# Patient Record
Sex: Male | Born: 1960 | Race: White | Hispanic: Yes | Marital: Married | Smoking: Never smoker
Health system: Southern US, Community
[De-identification: ages and names within clinical notes are randomized; demographics above are authoritative.]

## PROBLEM LIST (undated history)

## (undated) DIAGNOSIS — J45909 Unspecified asthma, uncomplicated: Secondary | ICD-10-CM

## (undated) DIAGNOSIS — D649 Anemia, unspecified: Secondary | ICD-10-CM

## (undated) DIAGNOSIS — K219 Gastro-esophageal reflux disease without esophagitis: Secondary | ICD-10-CM

## (undated) DIAGNOSIS — E785 Hyperlipidemia, unspecified: Secondary | ICD-10-CM

## (undated) DIAGNOSIS — M1990 Unspecified osteoarthritis, unspecified site: Secondary | ICD-10-CM

## (undated) DIAGNOSIS — K4090 Unilateral inguinal hernia, without obstruction or gangrene, not specified as recurrent: Secondary | ICD-10-CM

## (undated) DIAGNOSIS — T7840XA Allergy, unspecified, initial encounter: Secondary | ICD-10-CM

## (undated) DIAGNOSIS — K625 Hemorrhage of anus and rectum: Secondary | ICD-10-CM

## (undated) HISTORY — DX: Hyperlipidemia, unspecified: E78.5

## (undated) HISTORY — DX: Gastro-esophageal reflux disease without esophagitis: K21.9

## (undated) HISTORY — DX: Unspecified asthma, uncomplicated: J45.909

## (undated) HISTORY — PX: COLONOSCOPY: SHX174

## (undated) HISTORY — DX: Allergy, unspecified, initial encounter: T78.40XA

## (undated) HISTORY — PX: UPPER GASTROINTESTINAL ENDOSCOPY: SHX188

## (undated) HISTORY — DX: Unspecified osteoarthritis, unspecified site: M19.90

## (undated) HISTORY — PX: HEMORRHOID SURGERY: SHX153

## (undated) HISTORY — DX: Hemorrhage of anus and rectum: K62.5

## (undated) HISTORY — DX: Unilateral inguinal hernia, without obstruction or gangrene, not specified as recurrent: K40.90

## (undated) HISTORY — DX: Anemia, unspecified: D64.9

---

## 1990-11-20 HISTORY — PX: NOSE SURGERY: SHX723

## 2006-11-08 ENCOUNTER — Ambulatory Visit: Payer: Self-pay | Admitting: Family Medicine

## 2006-11-16 ENCOUNTER — Encounter: Admission: RE | Admit: 2006-11-16 | Discharge: 2006-11-16 | Payer: Self-pay | Admitting: Sports Medicine

## 2006-12-07 ENCOUNTER — Ambulatory Visit: Payer: Self-pay | Admitting: Family Medicine

## 2007-01-16 ENCOUNTER — Ambulatory Visit: Payer: Self-pay | Admitting: Family Medicine

## 2007-01-17 DIAGNOSIS — K219 Gastro-esophageal reflux disease without esophagitis: Secondary | ICD-10-CM | POA: Insufficient documentation

## 2007-01-17 DIAGNOSIS — K649 Unspecified hemorrhoids: Secondary | ICD-10-CM | POA: Insufficient documentation

## 2007-01-25 ENCOUNTER — Encounter: Payer: Self-pay | Admitting: *Deleted

## 2007-01-25 ENCOUNTER — Ambulatory Visit: Payer: Self-pay | Admitting: Sports Medicine

## 2007-01-25 ENCOUNTER — Encounter (INDEPENDENT_AMBULATORY_CARE_PROVIDER_SITE_OTHER): Payer: Self-pay | Admitting: Family Medicine

## 2007-01-25 DIAGNOSIS — E785 Hyperlipidemia, unspecified: Secondary | ICD-10-CM | POA: Insufficient documentation

## 2007-01-25 DIAGNOSIS — R5381 Other malaise: Secondary | ICD-10-CM | POA: Insufficient documentation

## 2007-01-25 DIAGNOSIS — R5383 Other fatigue: Secondary | ICD-10-CM

## 2007-01-28 ENCOUNTER — Encounter (INDEPENDENT_AMBULATORY_CARE_PROVIDER_SITE_OTHER): Payer: Self-pay | Admitting: Family Medicine

## 2007-01-28 ENCOUNTER — Encounter (INDEPENDENT_AMBULATORY_CARE_PROVIDER_SITE_OTHER): Payer: Self-pay

## 2007-01-28 LAB — CONVERTED CEMR LAB
BUN: 14 mg/dL (ref 6–23)
Chloride: 100 meq/L (ref 96–112)
Cholesterol: 207 mg/dL — ABNORMAL HIGH (ref 0–200)
Glucose, Bld: 83 mg/dL (ref 70–99)
LDL Cholesterol: 139 mg/dL — ABNORMAL HIGH (ref 0–99)
Potassium: 4.3 meq/L (ref 3.5–5.3)
Sodium: 137 meq/L (ref 135–145)
TSH: 0.564 microintl units/mL (ref 0.350–5.50)
Total CHOL/HDL Ratio: 3.7

## 2007-02-26 ENCOUNTER — Encounter (INDEPENDENT_AMBULATORY_CARE_PROVIDER_SITE_OTHER): Payer: Self-pay | Admitting: Family Medicine

## 2007-02-26 ENCOUNTER — Ambulatory Visit: Payer: Self-pay | Admitting: Family Medicine

## 2007-02-26 DIAGNOSIS — R7401 Elevation of levels of liver transaminase levels: Secondary | ICD-10-CM | POA: Insufficient documentation

## 2007-02-26 DIAGNOSIS — R740 Nonspecific elevation of levels of transaminase and lactic acid dehydrogenase [LDH]: Secondary | ICD-10-CM

## 2007-02-26 DIAGNOSIS — M6580 Other synovitis and tenosynovitis, unspecified site: Secondary | ICD-10-CM | POA: Insufficient documentation

## 2007-02-27 ENCOUNTER — Encounter (INDEPENDENT_AMBULATORY_CARE_PROVIDER_SITE_OTHER): Payer: Self-pay | Admitting: Family Medicine

## 2007-02-27 LAB — CONVERTED CEMR LAB
CO2: 26 meq/L (ref 19–32)
Calcium: 9.9 mg/dL (ref 8.4–10.5)
Chloride: 104 meq/L (ref 96–112)
Creatinine, Ser: 0.82 mg/dL (ref 0.40–1.50)
Potassium: 3.9 meq/L (ref 3.5–5.3)

## 2008-07-30 ENCOUNTER — Ambulatory Visit: Payer: Self-pay | Admitting: Family Medicine

## 2008-07-30 ENCOUNTER — Encounter (INDEPENDENT_AMBULATORY_CARE_PROVIDER_SITE_OTHER): Payer: Self-pay | Admitting: Family Medicine

## 2008-07-30 DIAGNOSIS — R300 Dysuria: Secondary | ICD-10-CM | POA: Insufficient documentation

## 2008-07-30 DIAGNOSIS — Z8719 Personal history of other diseases of the digestive system: Secondary | ICD-10-CM | POA: Insufficient documentation

## 2008-07-30 DIAGNOSIS — M7710 Lateral epicondylitis, unspecified elbow: Secondary | ICD-10-CM | POA: Insufficient documentation

## 2008-07-30 LAB — CONVERTED CEMR LAB
Bilirubin Urine: NEGATIVE
Glucose, Urine, Semiquant: NEGATIVE
Protein, U semiquant: NEGATIVE
Urobilinogen, UA: 0.2
WBC Urine, dipstick: NEGATIVE

## 2008-08-02 DIAGNOSIS — Z87448 Personal history of other diseases of urinary system: Secondary | ICD-10-CM | POA: Insufficient documentation

## 2008-08-07 ENCOUNTER — Ambulatory Visit: Payer: Self-pay | Admitting: Family Medicine

## 2008-08-07 LAB — CONVERTED CEMR LAB
Blood in Urine, dipstick: NEGATIVE
Glucose, Urine, Semiquant: NEGATIVE
Specific Gravity, Urine: 1.02
Urobilinogen, UA: 0.2

## 2008-08-10 ENCOUNTER — Encounter (INDEPENDENT_AMBULATORY_CARE_PROVIDER_SITE_OTHER): Payer: Self-pay | Admitting: Family Medicine

## 2008-08-10 LAB — CONVERTED CEMR LAB
AST: 18 units/L (ref 0–37)
Alkaline Phosphatase: 83 units/L (ref 39–117)
Chloride: 105 meq/L (ref 96–112)
Creatinine, Ser: 0.8 mg/dL (ref 0.40–1.50)
HDL: 44 mg/dL (ref >39)
Hemoglobin: 12.9 g/dL — ABNORMAL LOW (ref 13.0–17.0)
LDL Cholesterol: 143 mg/dL — ABNORMAL HIGH (ref 0–99)
MCV: 88 fL (ref 78.0–100.0)
RBC: 4.43 M/uL (ref 4.22–5.81)
RDW: 13.9 % (ref 11.5–15.5)
Total CHOL/HDL Ratio: 5.1
Total Protein: 7.8 g/dL (ref 6.0–8.3)

## 2010-03-14 ENCOUNTER — Emergency Department (HOSPITAL_BASED_OUTPATIENT_CLINIC_OR_DEPARTMENT_OTHER): Admission: EM | Admit: 2010-03-14 | Discharge: 2010-03-14 | Payer: Self-pay | Admitting: Emergency Medicine

## 2011-01-01 ENCOUNTER — Encounter: Payer: Self-pay | Admitting: *Deleted

## 2011-05-22 ENCOUNTER — Ambulatory Visit (INDEPENDENT_AMBULATORY_CARE_PROVIDER_SITE_OTHER): Payer: Self-pay | Admitting: Surgery

## 2011-06-02 ENCOUNTER — Encounter (INDEPENDENT_AMBULATORY_CARE_PROVIDER_SITE_OTHER): Payer: Self-pay | Admitting: General Surgery

## 2011-06-02 ENCOUNTER — Ambulatory Visit (INDEPENDENT_AMBULATORY_CARE_PROVIDER_SITE_OTHER): Payer: 59 | Admitting: Surgery

## 2011-06-02 ENCOUNTER — Encounter (INDEPENDENT_AMBULATORY_CARE_PROVIDER_SITE_OTHER): Payer: Self-pay | Admitting: Surgery

## 2011-06-02 VITALS — BP 132/90 | HR 79 | Temp 96.8°F | Ht 65.0 in | Wt 167.8 lb

## 2011-06-02 DIAGNOSIS — K648 Other hemorrhoids: Secondary | ICD-10-CM

## 2011-06-02 NOTE — Patient Instructions (Signed)
Follow up in three weeks.  High fiber diet.  Drink plenty of water.  Expect some bleeding.

## 2011-06-02 NOTE — Progress Notes (Signed)
Subjective:     Patient ID: Keith Long, male   DOB: 09-11-1961, 50 y.o.   MRN: [id number]    BP 132/90  Pulse 79  Temp 96.8 F (36 C)  Ht 5\' 5"  (1.651 m)  Wt 167 lb 12.8 oz (76.114 kg)  BMI 27.92 kg/m2    HPI The patient returns to clinic today. He has grade 2 hemorrhoid disease. His complaints are bleeding, itching, and fullness of the anus. He also has some burning and itching and I saw him a month ago in the office, and he wished to return to have sclerotherapy done. I discussed the procedure with him today. Risks of bleeding, infection, recurrence, and pain were discussed. I contrast this with banding and hemorrhoidectomy. After discussion he wished to proceed with sclerotherapy.   Review of Systems  Constitutional: Negative.   HENT: Negative.   Eyes: Negative.   Respiratory: Negative.   Gastrointestinal: Negative.   Hematological: Negative.   Psychiatric/Behavioral: Negative.        Objective:   Physical Exam  Constitutional: He appears well-developed and well-nourished.  HENT:  Head: Normocephalic and atraumatic.  Nose: Nose normal.  Eyes: Conjunctivae and EOM are normal.  Neck: Normal range of motion. Neck supple.  Genitourinary:       The anal canal is without mass.  Tone normal.  Anoscopy shows grade 2 to 3 left lateral internal pile.  The anoscope was used.  A right posterior pile was also seen.  Each were injected with 1.5 cc of sclerosant. He tolerated this well.  Musculoskeletal: Normal range of motion.  Skin: Skin is warm and dry.  Psychiatric: He has a normal mood and affect. His behavior is normal. Judgment and thought content normal.       Assessment:  Grade 2 to 3 internal hemorrhoids 2 columns.   Plan:  I injected his internal hemorrhoids today with sclerosant. He tolerated the procedure well. He will return in 2-3 weeks for a recheck. Recommend a high fiber diet and increasing his water intake. Recommend avoiding prolonged straining with bowel  movements.

## 2011-07-04 ENCOUNTER — Encounter (INDEPENDENT_AMBULATORY_CARE_PROVIDER_SITE_OTHER): Payer: Self-pay | Admitting: Surgery

## 2011-07-04 ENCOUNTER — Ambulatory Visit (INDEPENDENT_AMBULATORY_CARE_PROVIDER_SITE_OTHER): Payer: 59 | Admitting: Surgery

## 2011-07-04 DIAGNOSIS — K648 Other hemorrhoids: Secondary | ICD-10-CM

## 2011-07-04 NOTE — Progress Notes (Signed)
The patient returns to clinic today. He is doing better. Patient states he is having no bleeding from his rectum, itching or discharge. He is 3 weeks out from sclerotherapy for bleeding hemorrhoids.  Review of systems: Negative for rectal bleeding, itching or discharge  Physical exam:  GI: Anal canal is without bleeding. No evidence of hemorrhoid prolapse. No erythema. No fissure.  Neuro: Glasgow Coma Scale is 15 motor and sensory function grossly intact  Musculoskeletal: Good muscle tone. Good range of motion.  Impression grade 3 internal hemorrhoids status post sclerotherapy  Plan: He will return if symptoms recur. I recommended a high fiber diet to him. I've encouraged him to exercise daily. I've encouraged him to refrain from pro long sitting on the commode.

## 2011-07-04 NOTE — Patient Instructions (Signed)
Continue high fiber diet.  Exercise daily.  Call if symptoms return. Avoid prolonged sitting on commode.

## 2012-03-27 ENCOUNTER — Other Ambulatory Visit: Payer: Self-pay | Admitting: Family Medicine

## 2012-03-27 DIAGNOSIS — R911 Solitary pulmonary nodule: Secondary | ICD-10-CM

## 2012-04-01 ENCOUNTER — Ambulatory Visit
Admission: RE | Admit: 2012-04-01 | Discharge: 2012-04-01 | Disposition: A | Discharge status: Home / Self Care | Payer: 59 | Source: Ambulatory Visit | Attending: Family Medicine | Admitting: Family Medicine

## 2012-04-01 DIAGNOSIS — R911 Solitary pulmonary nodule: Secondary | ICD-10-CM

## 2013-05-05 ENCOUNTER — Ambulatory Visit (INDEPENDENT_AMBULATORY_CARE_PROVIDER_SITE_OTHER): Payer: 59 | Admitting: Surgery

## 2013-05-05 ENCOUNTER — Encounter (INDEPENDENT_AMBULATORY_CARE_PROVIDER_SITE_OTHER): Payer: Self-pay | Admitting: Surgery

## 2013-05-05 VITALS — BP 122/70 | HR 71 | Temp 98.1°F | Resp 16 | Ht 65.0 in | Wt 163.6 lb

## 2013-05-05 DIAGNOSIS — K4090 Unilateral inguinal hernia, without obstruction or gangrene, not specified as recurrent: Secondary | ICD-10-CM

## 2013-05-05 NOTE — Progress Notes (Signed)
General Surgery [facility]  Chief Complaint  Patient presents with  . New Evaluation    evaluate for right inguinal hernia - referral from Dr. Juline Patch, [facility]    HISTORY: Patient is a 52 year old male referred by his primary care physician for newly diagnosed right inguinal hernia. Patient has stated that he has had intermittent right groin pain associated with physical activity for approximately one to 2 years. There has been a small bulge in the right groin which is gradually become larger. He denies any signs or symptoms of obstruction. He has had no prior abdominal surgery. He has had no symptoms on the left side.  Past Medical History  Diagnosis Date  . Hemorrhoids   . Rectal bleeding      No current outpatient prescriptions on file.   No current facility-administered medications for this visit.     No Known Allergies   Family History  Problem Relation Age of Onset  . Diabetes Mother   . Stroke Father      History   Social History  . Marital Status: Married    Spouse Name: N/A    Number of Children: N/A  . Years of Education: N/A   Social History Main Topics  . Smoking status: Never Smoker   . Smokeless tobacco: None  . Alcohol Use: Yes     Comment: occasional  . Drug Use: No  . Sexually Active: None   Other Topics Concern  . None   Social History Narrative  . None     REVIEW OF SYSTEMS - PERTINENT POSITIVES ONLY: Denies signs or symptoms of obstruction. Denies previous abdominal surgery.  No prior hernia repairs.  EXAM: Filed Vitals:   05/05/13 1344  BP: 122/70  Pulse: 71  Temp: 98.1 F (36.7 C)  Resp: 16    HEENT: normocephalic; pupils equal and reactive; sclerae clear; dentition good; mucous membranes moist NECK:  symmetric on extension; no palpable anterior or posterior cervical lymphadenopathy; no supraclavicular masses; no tenderness CHEST: clear to auscultation bilaterally without  rales, rhonchi, or wheezes CARDIAC: regular rate and rhythm without significant murmur; peripheral pulses are full ABDOMEN: soft without distension; bowel sounds present; no mass; no hepatosplenomegaly; no hernia GU:  Normal male genitalia without mass or lesion; prominent bulge in the right groin compared to left; palpation in right shows a small to moderate right inguinal hernia which augments with cough and Valsalva; reducible; minimally tender; palpation in the left inguinal canal with cough and Valsalva shows no sign of hernia EXT:  non-tender without edema; no deformity NEURO: no gross focal deficits; no sign of tremor   LABORATORY RESULTS: See [facility] ([facility]Epic) for most recent results   RADIOLOGY RESULTS: See [facility] (CHL[facility]Epic) for most recent results   IMPRESSION: Right inguinal hernia, reducible, mildly symptomatic  PLAN: I discussed with the patient the indications for surgical repair. I provided him with written literature to review. We discussed the options for management. I have recommended open right inguinal hernia repair with mesh do to its low recurrence rate. We discussed the procedure, the restrictions on his activities after surgery, and his recovery and return to full activity. He understands and wishes to proceed in the near future.  The risks and benefits of the procedure have been discussed at length with the patient.  The patient understands the proposed procedure, potential alternative treatments, and the course of recovery to be expected.  All of the patient's questions have been answered  at this time.  The patient wishes to proceed with surgery.  Velora Heckler, MD, FACS General & Endocrine Surgery [facility]   Visit Diagnoses: 1. Inguinal hernia unilateral, non-recurrent, right     Primary Care Physician: Juline Patch, MD

## 2013-05-05 NOTE — Patient Instructions (Signed)
[facility]  HERNIA REPAIR POST OP INSTRUCTIONS  Always review your discharge instruction sheet given to you by the facility where your surgery was performed.  1. A  prescription for pain medication may be given to you upon discharge.  Take your pain medication as prescribed.  If narcotic pain medicine is not needed, then you may take acetaminophen (Tylenol) or ibuprofen (Advil) as needed.  2. Take your usually prescribed medications unless otherwise directed.  3. If you need a refill on your pain medication, please contact your pharmacy.  They will contact our office to request authorization. Prescriptions will not be filled after 5 pm daily or on weekends.  4. You should follow a light diet the first 24 hours after arrival home, such as soup and crackers or toast.  Be sure to include plenty of fluids daily.  Resume your normal diet the day after surgery.  5. Most patients will experience some swelling and bruising around the surgical site.  Ice packs and reclining will help.  Swelling and bruising can take several days to resolve.   6. It is common to experience some constipation if taking pain medication after surgery.  Increasing fluid intake and taking a stool softener (such as Colace) will usually help or prevent this problem from occurring.  A mild laxative (Milk of Magnesia or Miralax) should be taken according to package directions if there are no bowel movements after 48 hours.  7. Unless discharge instructions indicate otherwise, you may remove your bandages 24-48 hours after surgery, and you may shower at that time.  You may have steri-strips (small skin tapes) in place directly over the incision.  These strips should be left on the skin for 7-10 days.  If your surgeon used skin glue on the incision, you may shower in 24 hours.  The glue will flake off over the next 2-3 weeks.  Any sutures or staples will be removed at the office during your follow-up  visit.  8. ACTIVITIES:  You may resume regular (light) daily activities beginning the next day-such as daily self-care, walking, climbing stairs-gradually increasing activities as tolerated.  You may have sexual intercourse when it is comfortable.  Refrain from any heavy lifting or straining until approved by your doctor.  You may drive when you are no longer taking prescription pain medication, you can comfortably wear a seatbelt, and you can safely maneuver your car and apply brakes.  9. You should see your doctor in the office for a follow-up appointment approximately 2-3 weeks after your surgery.  Make sure that you call for this appointment within a day or two after you arrive home to insure a convenient appointment time. 10.   WHEN TO CALL YOUR DOCTOR: 1. Fever greater than 101.0 2. Inability to urinate 3. Persistent nausea and/or vomiting 4. Extreme swelling or bruising 5. Continued bleeding from incision 6. Increased pain, redness, or drainage from the incision  The clinic staff is available to answer your questions during regular business hours.  Please don't hesitate to call and ask to speak to one of the nurses for clinical concerns.  If you have a medical emergency, go to the nearest emergency room or call 911.  A surgeon from [facility] is always on call for the hospital.   [facility] [address], [address], [place], [place]  [address]  [phone] ? [phone] ? FAX [phone]  [email or web address]   

## 2014-11-20 DIAGNOSIS — K4090 Unilateral inguinal hernia, without obstruction or gangrene, not specified as recurrent: Secondary | ICD-10-CM

## 2014-11-20 HISTORY — DX: Unilateral inguinal hernia, without obstruction or gangrene, not specified as recurrent: K40.90

## 2015-05-11 ENCOUNTER — Other Ambulatory Visit (INDEPENDENT_AMBULATORY_CARE_PROVIDER_SITE_OTHER): Payer: BLUE CROSS/BLUE SHIELD

## 2015-05-11 ENCOUNTER — Encounter: Payer: Self-pay | Admitting: Internal Medicine

## 2015-05-11 ENCOUNTER — Ambulatory Visit (INDEPENDENT_AMBULATORY_CARE_PROVIDER_SITE_OTHER): Payer: BLUE CROSS/BLUE SHIELD | Admitting: Internal Medicine

## 2015-05-11 ENCOUNTER — Ambulatory Visit (INDEPENDENT_AMBULATORY_CARE_PROVIDER_SITE_OTHER)
Admission: RE | Admit: 2015-05-11 | Discharge: 2015-05-11 | Disposition: A | Discharge status: Home / Self Care | Payer: BLUE CROSS/BLUE SHIELD | Source: Ambulatory Visit | Attending: Internal Medicine | Admitting: Internal Medicine

## 2015-05-11 VITALS — BP 138/84 | HR 72 | Ht 65.0 in | Wt 164.2 lb

## 2015-05-11 DIAGNOSIS — J45991 Cough variant asthma: Secondary | ICD-10-CM

## 2015-05-11 LAB — CBC WITH DIFFERENTIAL/PLATELET
BASOS PCT: 0.7 % (ref 0.0–3.0)
Basophils Absolute: 0.1 10*3/uL (ref 0.0–0.1)
EOS ABS: 0.5 10*3/uL (ref 0.0–0.7)
Eosinophils Relative: 6.1 % — ABNORMAL HIGH (ref 0.0–5.0)
HCT: 40.5 % (ref 39.0–52.0)
Hemoglobin: 13.4 g/dL (ref 13.0–17.0)
Lymphocytes Relative: 43.5 % (ref 12.0–46.0)
Lymphs Abs: 3.2 10*3/uL (ref 0.7–4.0)
MCHC: 33.1 g/dL (ref 30.0–36.0)
MCV: 83.2 fl (ref 78.0–100.0)
MONO ABS: 0.4 10*3/uL (ref 0.1–1.0)
Monocytes Relative: 5.3 % (ref 3.0–12.0)
NEUTROS PCT: 44.4 % (ref 43.0–77.0)
Neutro Abs: 3.3 10*3/uL (ref 1.4–7.7)
Platelets: 292 10*3/uL (ref 150.0–400.0)
RBC: 4.86 Mil/uL (ref 4.22–5.81)
RDW: 15 % (ref 11.5–15.5)
WBC: 7.4 10*3/uL (ref 4.0–10.5)

## 2015-05-11 MED ORDER — PREDNISONE 10 MG PO TABS: ORAL_TABLET | ORAL | Status: DC

## 2015-05-11 MED ORDER — AMOXICILLIN-POT CLAVULANATE 875-125 MG PO TABS: 1.0000 | ORAL_TABLET | Freq: Two times a day (BID) | ORAL | Status: DC

## 2015-05-11 MED ORDER — FAMOTIDINE 20 MG PO TABS: ORAL_TABLET | ORAL | Status: DC

## 2015-05-11 MED ORDER — MONTELUKAST SODIUM 10 MG PO TABS: 10.0000 mg | ORAL_TABLET | Freq: Every day | ORAL | Status: DC

## 2015-05-11 MED ORDER — BENZONATATE 200 MG PO CAPS: ORAL_CAPSULE | ORAL | Status: DC

## 2015-05-11 MED ORDER — ALBUTEROL SULFATE HFA 108 (90 BASE) MCG/ACT IN AERS: 2.0000 | INHALATION_SPRAY | Freq: Four times a day (QID) | RESPIRATORY_TRACT | Status: DC | PRN

## 2015-05-11 MED ORDER — OMEPRAZOLE 20 MG PO CPDR: DELAYED_RELEASE_CAPSULE | ORAL | Status: DC

## 2015-05-11 MED ORDER — MOMETASONE FURO-FORMOTEROL FUM 100-5 MCG/ACT IN AERO: 2.0000 | INHALATION_SPRAY | Freq: Two times a day (BID) | RESPIRATORY_TRACT | Status: DC

## 2015-05-11 NOTE — Assessment & Plan Note (Signed)
The most common causes of chronic cough in immunocompetent adults include the following: upper airway cough syndrome (UACS), previously referred to as postnasal drip syndrome (PNDS), which is caused by variety of rhinosinus conditions; (2) asthma; (3) GERD; (4) chronic bronchitis from cigarette smoking or other inhaled environmental irritants; (5) nonasthmatic eosinophilic bronchitis; and (6) bronchiectasis.   These conditions, singly or in combination, have accounted for up to 94% of the causes of chronic cough in prospective studies.   Other conditions have constituted no >6% of the causes in prospective studies These have included bronchogenic carcinoma, chronic interstitial pneumonia, sarcoidosis, left ventricular failure, ACEI-induced cough, and aspiration from a condition associated with pharyngeal dysfunction.    Chronic cough is often simultaneously caused by more than one condition. A single cause has been found from 38 to 82% of the time, multiple causes from 18 to 62%. Multiply caused cough has been the result of three diseases up to 42% of the time.       Most likely this is combination of gerd/ ab and chronic rhinitis sinusitis, but difficult to tease out as they may be interrelated  Of the three most common causes of chronic cough, only one (GERD)  can actually cause the other two (asthma and post nasal drip syndrome)  and perpetuate the cylce of cough inducing airway trauma, inflammation, heightened sensitivity to reflux which is prompted by the cough itself via a cyclical mechanism.    This may partially respond to steroids and look like asthma and post nasal drainage but never erradicated completely unless the cough and the secondary reflux are eliminated, preferably both at the same time.  While not intuitively obvious, many patients with chronic low grade reflux do not cough until there is a secondary insult that disturbs the protective epithelial barrier and exposes sensitive  nerve endings.  This can be viral or direct physical injury such as with an endotracheal tube.   The point is that once this occurs, it is difficult to eliminate using anything but a maximally effective acid suppression regimen at least in the short run, accompanied by an appropriate diet to address non acid GERD.   I had an extended discussion with the patient reviewing all relevant studies completed to date - 59 m  - The proper method of use, as well as anticipated side effects, of a metered-dose inhaler are discussed and demonstrated to the patient. Improved effectiveness after extensive coaching during this visit to a level of approximately  90%  - .Each maintenance medication was reviewed in detail including most importantly the difference between maintenance and as needed and under what circumstances the prns are to be used.  Please see instructions for details which were reviewed in writing and the patient given a copy.

## 2015-05-11 NOTE — Patient Instructions (Addendum)
Change Prilosec 20 mg x 2 x 30 min before 1st meal and  Add pepcid ac 20 one at bedtime   Augmentin 875 mg take one pill twice daily  X 10 days - take at breakfast and supper with large glass of water.  It would help reduce the usual side effects (diarrhea and yeast infections) if you ate cultured yogurt at lunch.   Prednisone 10 mg take  4 each am x 2 days,   2 each am x 2 days,  1 each am x 2 days and stop   Start dulera 100 Take 2 puffs first thing in am and then another 2 puffs about 12 hours later  Stop singulair and proair and tessilon   GERD (REFLUX)  is an extremely common cause of respiratory symptoms just like yours , many times with no obvious heartburn at all.    It can be treated with medication, but also with lifestyle changes including elevation of the head of your bed (ideally with 6 inch  bed blocks),  Smoking cessation, avoidance of late meals, excessive alcohol, and avoid fatty foods, chocolate, peppermint, colas, red wine, and acidic juices such as orange juice.  NO MINT OR MENTHOL PRODUCTS SO NO COUGH DROPS  USE SUGARLESS CANDY INSTEAD (Jolley ranchers or Stover's or Life Savers) or even ice chips will also do - the key is to swallow to prevent all throat clearing. NO OIL BASED VITAMINS - use powdered substitutes.    Please remember to go to the lab and x-ray department downstairs for your tests - we will call you with the results when they are available.    Please schedule a follow up office visit in 2 weeks, sooner if needed and bring all meds with you

## 2015-05-11 NOTE — Progress Notes (Signed)
   Subjective:    Patient ID: Keith Long, male    DOB: Feb 24, 1961,   MRN: [id number]  HPI  54 yo Keith Long male never smoker never problems until developed recurrent sinus infections x 2014 eval [facility] and rx nasal sprays and gerd rx and self referred to pulmonary eval  On 05/11/2015   05/11/2015 1st [facility] office visit/ Keith Long   Chief Complaint  Patient presents with  . Pulmonary Consult    Self referral. Pt c/o cough x 2 yrs- progressively getting worse. Cough is prod with minimal yellow/blood streaked sputum. Cough is esp worse at night.   cough x 2 years daily worst at hs and also when gets hot every single day and worse in am better with abx/prednisone and no better with gerd rx/nasal steroids saba and tessalon also no help at all.  No obvious day to day or daytime variabilty or assoc sob or  cp or chest tightness, subjective wheeze overt  hb symptoms. No unusual exp hx or h/o childhood pna/ asthma or knowledge of premature birth.  Also denies any obvious fluctuation of symptoms with weather or environmental changes or other aggravating or alleviating factors except as outlined above   Current Medications, Allergies, Complete Past Medical History, Past Surgical History, Family History, and Social History were reviewed in Keith Long record.   Review of Systems  Constitutional: Negative for fever, chills, activity change, appetite change and unexpected weight change.  HENT: Positive for congestion. Negative for dental problem, postnasal drip, rhinorrhea, sneezing, sore throat, trouble swallowing and voice change.   Eyes: Negative for visual disturbance.  Respiratory: Positive for cough. Negative for choking and shortness of breath.   Cardiovascular: Negative for chest pain and leg swelling.  Gastrointestinal: Negative for nausea, vomiting and abdominal pain.  Genitourinary: Negative for difficulty urinating.       Acid heartburn  Indigestion    Musculoskeletal: Positive for arthralgias.  Skin: Negative for rash.  Psychiatric/Behavioral: Negative for behavioral problems and confusion.       Objective:   Physical Exam  amb latino male nad   Wt Readings from Last 3 Encounters:  05/11/15 164 lb 3.2 oz (74.481 kg)  05/05/13 163 lb 10.1 oz (74.222 kg)  06/02/11 167 lb 12.8 oz (76.114 kg)    Vital signs reviewed  HEENT: nl dentition,  and orophanx. Nl external ear canals without cough reflex - mod non-specific turbinate edema    NECK :  without JVD/Nodes/TM/ nl carotid upstrokes bilaterally   LUNGS: no acc muscle use, bilateral insp pops/squeaks    CV:  RRR  no s3 or murmur or increase in P2, no edema   ABD:  soft and nontender with nl excursion in the supine position. No bruits or organomegaly, bowel sounds nl  MS:  warm without deformities, calf tenderness, cyanosis or clubbing  SKIN: warm and dry without lesions    NEURO:  alert, approp, no deficits    CXR PA and Lateral:   05/11/2015 :     I personally reviewed images and agree with radiology impression as follows:    The heart size and mediastinal contours are within normal limits. Both lungs are clear. The visualized skeletal structures are unremarkable.   Labs ordered : allergy profile/ wbc differential      Assessment & Plan:

## 2015-05-12 ENCOUNTER — Encounter: Payer: Self-pay | Admitting: Internal Medicine

## 2015-05-12 LAB — ALLERGY FULL PROFILE
ASPERGILLUS FUMIGATUS M3: 0.12 kU/L — AB
Allergen, D pternoyssinus,d7: <0.1 kU/L
Allergen,Goose feathers, e70: <0.1 kU/L
BERMUDA GRASS: 0.2 kU/L — AB
Bahia Grass: 0.24 kU/L — ABNORMAL HIGH
Box Elder IgE: 1.89 kU/L — ABNORMAL HIGH
Common Ragweed: 0.21 kU/L — ABNORMAL HIGH
ELM IGE: 0.68 kU/L — AB
FESCUE: 1.48 kU/L — AB
G005 Rye, Perennial: 1.41 kU/L — ABNORMAL HIGH
G009 RED TOP: 1.77 kU/L — AB
GOLDENROD: 0.22 kU/L — AB
House Dust Hollister: <0.1 kU/L
IgE (Immunoglobulin E), Serum: 208 kU/L — ABNORMAL HIGH (ref <115)
LAMB'S QUARTERS CLASS: 0.3 kU/L — AB
Oak: 12.7 kU/L — ABNORMAL HIGH
PLANTAIN: 0.52 kU/L — AB
SYCAMORE TREE: 0.38 kU/L — AB
Stemphylium Botryosum: <0.1 kU/L
Timothy Grass: 0.7 kU/L — ABNORMAL HIGH

## 2015-05-17 ENCOUNTER — Telehealth: Payer: Self-pay | Admitting: Internal Medicine

## 2015-05-17 MED ORDER — CEFDINIR 300 MG PO CAPS: 300.0000 mg | ORAL_CAPSULE | Freq: Two times a day (BID) | ORAL | Status: DC

## 2015-05-17 NOTE — Telephone Encounter (Signed)
I called spoke with pt. He reports the augmentin is making him feel un easy and jittery, nauseated as well. Pt wants to change ABX. Please advise Dr. Sherene SiresWert thanks

## 2015-05-17 NOTE — Telephone Encounter (Signed)
Spoke with pt. Aware of recs. He has taken 6 tabs of the augmentin. Remaining RX sent in. Nothing further needed

## 2015-05-17 NOTE — Telephone Encounter (Signed)
omnicef 300 mg bid to complete the course of abx for whatever duration would have been left

## 2015-05-25 ENCOUNTER — Encounter: Payer: Self-pay | Admitting: Internal Medicine

## 2015-05-25 ENCOUNTER — Ambulatory Visit (INDEPENDENT_AMBULATORY_CARE_PROVIDER_SITE_OTHER): Payer: BLUE CROSS/BLUE SHIELD | Admitting: Internal Medicine

## 2015-05-25 VITALS — BP 130/74 | HR 72 | Ht 65.0 in | Wt 164.6 lb

## 2015-05-25 DIAGNOSIS — J45991 Cough variant asthma: Secondary | ICD-10-CM | POA: Diagnosis not present

## 2015-05-25 MED ORDER — BUDESONIDE-FORMOTEROL FUMARATE 80-4.5 MCG/ACT IN AERO: INHALATION_SPRAY | RESPIRATORY_TRACT | Status: DC

## 2015-05-25 NOTE — Patient Instructions (Addendum)
symbicort 80 Take 2 puffs first thing in am and then another 2 puffs about 12 hours later  Work on inhaler technique:  relax and gently blow all the way out then take a nice smooth deep breath back in, triggering the inhaler at same time you start breathing in.  Hold for up to 5 seconds if you can. Blow out through your nose.  Rinse and gargle with water when done  [facility]   [facility] near you (Dr Caryn BeeKevin Little/Alan Ross/Wendy Uvaldo [place]) or  [facility] [place] other option Dr Drue NovelPaz   Please schedule a follow up visit in 3 months but call sooner if needed

## 2015-05-25 NOTE — Progress Notes (Signed)
Subjective:    Patient ID: Keith Long, male    DOB: 06-Aug-1961,   MRN: [id number]    Brief patient profile:  54 yo [place] male never smoker never problems until developed recurrent sinus infections while [place] 1999 x 2014 eval [facility] and rx nasal sprays and gerd rx and self referred to pulmonary eval  On 05/11/2015    History of Present Illness  05/11/2015 1st [facility] office visit/ [place]   Chief Complaint  Patient presents with  . Pulmonary Consult    Self referral. Pt c/o cough x 2 yrs- progressively getting worse. Cough is prod with minimal yellow/blood streaked sputum. Cough is esp worse at night.   cough x 2 years daily worst at hs and also when gets hot every single day and worse in am better with abx/prednisone and no better with gerd rx/nasal steroids saba and tessalon also no help at all. rec Change Prilosec 20 mg x 2 x 30 min before 1st meal and  Add pepcid ac 20 one at bedtime  Augmentin 875 mg take one pill twice daily  X 10 days - take at breakfast and supper with large glass of water.  It would help reduce the usual side effects (diarrhea and yeast infections) if you ate cultured yogurt at lunch.  Prednisone 10 mg take  4 each am x 2 days,   2 each am x 2 days,  1 each am x 2 days and stop  Start dulera 100 Take 2 puffs first thing in am and then another 2 puffs about 12 hours later Stop singulair and proair and tessilon  GERD  Diet      05/25/2015 f/u ov/Kamilya Wakeman re: asthma on dulera 100 2bid and gerd rx  Chief Complaint  Patient presents with  . Follow-up    Cough is much improved. No new co's today.    Not limited by breathing from desired activities  / some persistent nasal congestion  No obvious day to day or daytime variabilty or assoc chronic cough or cp or chest tightness, subjective wheeze overt  hb symptoms. No unusual exp hx or h/o childhood pna/ asthma or knowledge of premature birth.  Sleeping ok without nocturnal  or early am exacerbation  of  respiratory  c/o's or need for noct saba. Also denies any obvious fluctuation of symptoms with weather or environmental changes or other aggravating or alleviating factors except as outlined above   Current Medications, Allergies, Complete Past Medical History, Past Surgical History, Family History, and Social History were reviewed [facility].  ROS  The following are not active complaints unless bolded sore throat, dysphagia, dental problems, itching, sneezing,  nasal congestion or excess/ purulent secretions, ear ache,   fever, chills, sweats, unintended wt loss, pleuritic or exertional cp, hemoptysis,  orthopnea pnd or leg swelling, presyncope, palpitations, abdominal pain, anorexia, nausea, vomiting, diarrhea  or change in bowel or urinary habits, change in stools or urine, dysuria,hematuria,  rash, arthralgias, visual complaints, headache, numbness weakness or ataxia or problems with walking or coordination,  change in mood/affect or memory.          Objective:   Physical Exam  amb latino male nad with nasal tone to voice    05/25/2015         165  Wt Readings from Last 3 Encounters:  05/11/15 164 lb 3.2 oz (74.481 kg)  05/05/13 163 lb 10.1 oz (74.222 kg)  06/02/11 167 lb 12.8 oz (76.114  kg)    Vital signs reviewed  HEENT: nl dentition,  and orophanx. Nl external ear canals without cough reflex - mod non-specific turbinate edema    NECK :  without JVD/Nodes/TM/ nl carotid upstrokes bilaterally   LUNGS: no acc muscle use, clear to  A and P    CV:  RRR  no s3 or murmur or increase in P2, no edema   ABD:  soft and nontender with nl excursion in the supine position. No bruits or organomegaly, bowel sounds nl  MS:  warm without deformities, calf tenderness, cyanosis or clubbing  SKIN: warm and dry without lesions    NEURO:  alert, approp, no deficits    CXR PA and Lateral:   05/11/2015 :     I personally reviewed images and agree with  radiology impression as follows:    The heart size and mediastinal contours are within normal limits. Both lungs are clear. The visualized skeletal structures are unremarkable.       Assessment & Plan:

## 2015-05-26 ENCOUNTER — Encounter: Payer: Self-pay | Admitting: Internal Medicine

## 2015-05-26 ENCOUNTER — Telehealth: Payer: Self-pay | Admitting: *Deleted

## 2015-05-26 NOTE — Telephone Encounter (Signed)
error 

## 2015-05-26 NOTE — Telephone Encounter (Signed)
-----   Message from Nyoka CowdenMichael B Wert, MD sent at 05/26/2015  6:05 AM EDT ----- Forgot to tell him try off gerd rx and see if worse in any way  if so restart

## 2015-05-26 NOTE — Assessment & Plan Note (Addendum)
-  05/11/2015 d/c singulair/ proair/ tessalon as not working  - 05/11/2015   try dulera 100 2bid - allergy profile 05/11/2015 > Eos 0.5,  IgE 208  Pos RAST Grass/trees/ ragweed - 05/25/2015 p extensive coaching HFA effectiveness =    90%    I had an extended discussion with the patient reviewing all relevant studies completed to date and  lasting 15 to 20 minutes of a 25 minute visit    Each maintenance medication was reviewed in detail including most importantly the difference between maintenance and prns and under what circumstances the prns are to be triggered using an action plan format that is not reflected in the computer generated alphabetically organized AVS.    Please see instructions for details which were reviewed in writing and the patient given a copy highlighting the part that I personally wrote and discussed at today's ov.   All goals of chronic asthma control met including optimal function and elimination of symptoms with minimal need for rescue therapy.  Contingencies discussed in full including contacting this office immediately if not controlling the symptoms using the rule of two's.     Formulary restrictions will be an ongoing challenge for the forseable future and I would be happy to pick an alternative if the pt will first  provide me a list of them but pt  will need to return here for training for any new device that is required eg dpi vs hfa vs respimat.    In meantime we can always provide samples so the patient never runs out of any needed respiratory medications.   For now as symbicort has 0 copay guarantee rec try symbicort 80 2bid and consider increasing the strength then maybe nucala if not well controlled going forward

## 2015-07-22 ENCOUNTER — Telehealth: Payer: Self-pay | Admitting: General Practice

## 2015-07-22 NOTE — Telephone Encounter (Signed)
As per 07/22/15 staff message Dr. Drue Novel approved new patient appointment. LVM advising patient to call office to schedule appointment.

## 2015-08-05 ENCOUNTER — Ambulatory Visit: Payer: BLUE CROSS/BLUE SHIELD | Admitting: Internal Medicine

## 2015-08-09 ENCOUNTER — Ambulatory Visit (INDEPENDENT_AMBULATORY_CARE_PROVIDER_SITE_OTHER): Payer: BLUE CROSS/BLUE SHIELD | Admitting: Internal Medicine

## 2015-08-09 ENCOUNTER — Encounter: Payer: Self-pay | Admitting: Internal Medicine

## 2015-08-09 VITALS — BP 124/74 | HR 74 | Temp 98.2°F | Ht 65.0 in | Wt 167.4 lb

## 2015-08-09 DIAGNOSIS — J45991 Cough variant asthma: Secondary | ICD-10-CM

## 2015-08-09 MED ORDER — BUDESONIDE-FORMOTEROL FUMARATE 80-4.5 MCG/ACT IN AERO: INHALATION_SPRAY | RESPIRATORY_TRACT | Status: DC

## 2015-08-09 MED ORDER — CEFDINIR 300 MG PO CAPS: 300.0000 mg | ORAL_CAPSULE | Freq: Two times a day (BID) | ORAL | Status: DC

## 2015-08-09 MED ORDER — PANTOPRAZOLE SODIUM 40 MG PO TBEC: 40.0000 mg | DELAYED_RELEASE_TABLET | Freq: Every day | ORAL | Status: DC

## 2015-08-09 MED ORDER — AMOXICILLIN-POT CLAVULANATE 875-125 MG PO TABS: 1.0000 | ORAL_TABLET | Freq: Two times a day (BID) | ORAL | Status: DC

## 2015-08-09 MED ORDER — PREDNISONE 10 MG PO TABS: ORAL_TABLET | ORAL | Status: DC

## 2015-08-09 NOTE — Progress Notes (Signed)
Subjective:    Patient ID: Keith Long, male    DOB: June 25, 1961,   MRN: [id number]    Brief patient profile:  54 yo [place] male  [employer] never smoker never problems until developed recurrent sinus infections while [place] 1999 x 2014 eval [facility] and rx nasal sprays and gerd rx and self referred to pulmonary eval  On 05/11/2015    History of Present Illness  05/11/2015 1st [facility] office visit/ Wert   Chief Complaint  Patient presents with  . Pulmonary Consult    Self referral. Pt c/o cough x 2 yrs- progressively getting worse. Cough is prod with minimal yellow/blood streaked sputum. Cough is esp worse at night.   cough x 2 years daily worst at hs and also when gets hot every single day and worse in am better with abx/prednisone and no better with gerd rx/nasal steroids saba and tessalon also no help at all. rec Change Prilosec 20 mg x 2 x 30 min before 1st meal and  Add pepcid ac 20 one at bedtime  Augmentin 875 mg take one pill twice daily  X 10 days - take at breakfast and supper with large glass of water.  It would help reduce the usual side effects (diarrhea and yeast infections) if you ate cultured yogurt at lunch.  Prednisone 10 mg take  4 each am x 2 days,   2 each am x 2 days,  1 each am x 2 days and stop  Start dulera 100 Take 2 puffs first thing in am and then another 2 puffs about 12 hours later Stop singulair and proair and tessilon  GERD  Diet      05/25/2015 f/u ov/[facility] re: asthma on dulera 100 2bid and gerd rx  Chief Complaint  Patient presents with  . Follow-up    Cough is much improved. No new co's today.    Not limited by breathing from desired activities  / some persistent nasal congestion rec symbicort 80 Take 2 puffs first thing in am and then another 2 puffs about 12 hours later Work on inhaler technique:  Ok to try off gerd rx    08/09/2015 acute extended ov/Wert re: much worse  X 3 weeks  Chief Complaint  Patient presents with    . Acute Visit    Pt c/o increased cough and wheezing x 2-3 wks, prod with yellow sputum. He also c/o sinus pressure     Was all better p last ov/ denies missing doses / hfa not optimal (see a/p)  Onset was gradual, minimally progressive, worse at hs   No obvious day to day or daytime variabilty or assoc   cp or chest tightness, overt  hb symptoms. No unusual exp hx or h/o childhood pna/ asthma or knowledge of premature birth.  Sleeping ok without nocturnal  or early am exacerbation  of respiratory  c/o's or need for noct saba. Also denies any obvious fluctuation of symptoms with weather or environmental changes or other aggravating or alleviating factors except as outlined above   Current Medications, Allergies, Complete Past Medical History, Past Surgical History, Family History, and Social History were reviewed in [name] record.  ROS  The following are not active complaints unless bolded sore throat, dysphagia, dental problems, itching, sneezing,  nasal congestion or excess/ purulent secretions, ear ache,   fever, chills, sweats, unintended wt loss, pleuritic or exertional cp, hemoptysis,  orthopnea pnd or leg swelling, presyncope, palpitations, abdominal pain, anorexia,  nausea, vomiting, diarrhea  or change in bowel or urinary habits, change in stools or urine, dysuria,hematuria,  rash, arthralgias, visual complaints, headache, numbness weakness or ataxia or problems with walking or coordination,  change in mood/affect or memory.          Objective:   Physical Exam  amb latino male nad     05/25/2015         165 > 08/09/2015   167  Wt Readings from Last 3 Encounters:  05/11/15 164 lb 3.2 oz (74.481 kg)  05/05/13 163 lb 10.1 oz (74.222 kg)  06/02/11 167 lb 12.8 oz (76.114 kg)    Vital signs reviewed  HEENT: nl dentition,  and orophanx. Nl external ear canals without cough reflex - severe  non-specific bilateral  turbinate edema    NECK :  without  JVD/Nodes/TM/ nl carotid upstrokes bilaterally   LUNGS: no acc muscle use, clear to  A and P, insp pops/squeaks both bases, min true exp wheeze    CV:  RRR  no s3 or murmur or increase in P2, no edema   ABD:  soft and nontender with nl excursion in the supine position. No bruits or organomegaly, bowel sounds nl  MS:  warm without deformities, calf tenderness, cyanosis or clubbing  SKIN: warm and dry without lesions    NEURO:  alert, approp, no deficits    CXR PA and Lateral:   05/11/2015 :     I personally reviewed images and agree with radiology impression as follows:    The heart size and mediastinal contours are within normal limits. Both lungs are clear. The visualized skeletal structures are unremarkable.       Assessment & Plan:

## 2015-08-09 NOTE — Patient Instructions (Addendum)
Protonix 40  before 1st meal and  Add pepcid ac 20 one at bedtime   Augmentin 875 mg take one pill twice daily  X 10 days - take at breakfast and supper with large glass of water.  It would help reduce the usual side effects (diarrhea and yeast infections) if you ate cultured yogurt at lunch.   Prednisone 10 mg take  4 each am x 2 days,   2 each am x 2 days,  1 each am x 2 days and stop   Continue symbicort 80 Take 2 puffs first thing in am and then another 2 puffs about 12 hours later.   Work on inhaler technique:  relax and gently blow all the way out then take a nice smooth deep breath back in, triggering the inhaler at same time you start breathing in.  Hold for up to 5 seconds if you can. Blow out thru nose. Rinse and gargle with water when done   GERD (REFLUX)  is an extremely common cause of respiratory symptoms just like yours , many times with no obvious heartburn at all.    It can be treated with medication, but also with lifestyle changes including elevation of the head of your bed (ideally with 6 inch  bed blocks),  Smoking cessation, avoidance of late meals, excessive alcohol, and avoid fatty foods, chocolate, peppermint, colas, red wine, and acidic juices such as orange juice.  NO MINT OR MENTHOL PRODUCTS SO NO COUGH DROPS  USE SUGARLESS CANDY INSTEAD (Jolley ranchers or Stover's or Life Savers) or even ice chips will also do - the key is to swallow to prevent all throat clearing. NO OIL BASED VITAMINS - use powdered substitutes.  Please schedule a follow up office visit in 2 weeks, sooner if needed with all active medications/ inhalers in hand

## 2015-08-10 ENCOUNTER — Encounter: Payer: Self-pay | Admitting: Internal Medicine

## 2015-08-10 NOTE — Assessment & Plan Note (Signed)
-   05/11/2015 d/c singulair/ proair/ tessalon as not working  - 05/11/2015   try dulera 100 2bid - allergy profile 05/11/2015 > Eos 0.5,  IgE 208  Pos RAST Grass/trees/ ragweed - 08/09/2015 p extensive coaching HFA effectiveness =    90%    Acute flare of symptoms ? Etiology > DDX of  difficult airways management all start with A and  include Adherence, Ace Inhibitors, Acid Reflux, Active Sinus Disease, Alpha 1 Antitripsin deficiency, Anxiety masquerading as Airways dz,  ABPA,  allergy(esp in young), Aspiration (esp in elderly), Adverse effects of meds,  Active smokers, A bunch of PE's (a small clot burden can't cause this syndrome unless there is already severe underlying pulm or vascular dz with poor reserve) plus two Bs  = Bronchiectasis and Beta blocker use..and one C= CHF   Adherence is always the initial "prime suspect" and is a multilayered concern that requires a "trust but verify" approach in every patient - starting with knowing how to use medications, especially inhalers, correctly, keeping up with refills and understanding the fundamental difference between maintenance and prns vs those medications only taken for a very short course and then stopped and not refilled.  - The proper method of use, as well as anticipated side effects, of a metered-dose inhaler are discussed and demonstrated to the patient. Improved effectiveness after extensive coaching during this visit to a level of approximately  90% from a baseline of < 50% so continue symbicort 80 2bid (higher doses may flare cough from upper airway)  ? Allergy as Pos RAST ragweed > Prednisone 10 mg take  4 each am x 2 days,   2 each am x 2 days,  1 each am x 2 days and stop - consider allergy eval next ? [place]as since pt's english is marginal  ? Active sinus dz > omnicef 300 bid (allergy to augmentin was rash) x 10 days then sinus ct next   ? Acid (or non-acid) GERD > always difficult to exclude as up to 75% of pts in some series report no  assoc GI/ Heartburn symptoms> rec max (24h)  acid suppression and diet restrictions/ reviewed and instructions given in writing.   I had an extended discussion with the patient reviewing all relevant studies completed to date and  lasting 15 to 20 minutes of a 25 minute visit    1)  I reviewed with the patient the process we employ  here of optimizing symptoms using a minimum number of medications using serial follow up visits to achieve the best outcome; however, this does require  a buy in / commitment on the pt's part to achieve and maintain accurate medication reconciliation and to take this process as seriously as we do or we are unlikely to achieve the stated goal.   2) Until we are sure the patient is doing what we asking them to do it makes no sense to ask them to do more> return in 2 weeks with all meds in hand  3) Each maintenance medication was reviewed in detail including most importantly the difference between maintenance and prns and under what circumstances the prns are to be triggered using an action plan format that is not reflected in the computer generated alphabetically organized AVS.    Please see instructions for details which were reviewed in writing and the patient given a copy highlighting the part that I personally wrote and discussed at today's ov.

## 2015-08-24 ENCOUNTER — Ambulatory Visit (INDEPENDENT_AMBULATORY_CARE_PROVIDER_SITE_OTHER): Payer: BLUE CROSS/BLUE SHIELD | Admitting: Internal Medicine

## 2015-08-24 ENCOUNTER — Encounter: Payer: Self-pay | Admitting: Internal Medicine

## 2015-08-24 VITALS — BP 130/86 | HR 67 | Ht 65.0 in | Wt 165.0 lb

## 2015-08-24 DIAGNOSIS — J309 Allergic rhinitis, unspecified: Secondary | ICD-10-CM

## 2015-08-24 DIAGNOSIS — J45991 Cough variant asthma: Secondary | ICD-10-CM | POA: Diagnosis not present

## 2015-08-24 DIAGNOSIS — Z23 Encounter for immunization: Secondary | ICD-10-CM

## 2015-08-24 MED ORDER — PREDNISONE 10 MG PO TABS: ORAL_TABLET | ORAL | Status: DC

## 2015-08-24 MED ORDER — MONTELUKAST SODIUM 10 MG PO TABS: 10.0000 mg | ORAL_TABLET | Freq: Every day | ORAL | Status: DC

## 2015-08-24 NOTE — Progress Notes (Signed)
Subjective:    Patient ID: Keith Long, male    DOB: 1961-04-22,   MRN: [id number]    Brief patient profile:  54 yo [place] male  [employer] never smoker never problems until developed recurrent sinus infections while [place] 1999 x 2014 eval [facility] and rx nasal sprays and gerd rx and self referred to pulmonary eval  On 05/11/2015    History of Present Illness  05/11/2015 1st [facility] office visit/ Verdie Barrows   Chief Complaint  Patient presents with  . Pulmonary Consult    Self referral. Pt c/o cough x 2 yrs- progressively getting worse. Cough is prod with minimal yellow/blood streaked sputum. Cough is esp worse at night.   cough x 2 years daily worst at hs and also when gets hot every single day and worse in am better with abx/prednisone and no better with gerd rx/nasal steroids saba and tessalon also no help at all. rec Change Prilosec 20 mg x 2 x 30 min before 1st meal and  Add pepcid ac 20 one at bedtime  Augmentin 875 mg take one pill twice daily  X 10 days - .  Prednisone 10 mg take  4 each am x 2 days,   2 each am x 2 days,  1 each am x 2 days and stop  Start dulera 100 Take 2 puffs first thing in am and then another 2 puffs about 12 hours later Stop singulair and proair and tessilon  GERD  Diet      05/25/2015 f/u ov/Anjannette Gauger re: asthma on dulera 100 2bid and gerd rx  Chief Complaint  Patient presents with  . Follow-up    Cough is much improved. No new co's today.   Not limited by breathing from desired activities  / some persistent nasal congestion rec symbicort 80 Take 2 puffs first thing in am and then another 2 puffs about 12 hours later Work on inhaler technique:  Ok to try off gerd rx    08/09/2015 acute extended ov/Meilin Brosh re: much worse  X 3 weeks  Chief Complaint  Patient presents with  . Acute Visit    Pt c/o increased cough and wheezing x 2-3 wks, prod with yellow sputum. He also c/o sinus pressure   Was all better p last ov/ denies missing doses /  hfa not optimal (see a/p)  Onset was gradual, minimally progressive, worse at hs  rec Protonix 40  before 1st meal and  Add pepcid ac 20 one at bedtime  Augmentin 875 mg take one pill twice daily  X 10 days - Prednisone 10 mg take  4 each am x 2 days,   2 each am x 2 days,  1 each am x 2 days and stop  Continue symbicort 80 Take 2 puffs first thing in am and then another 2 puffs about 12 hours later.  Work on inhaler technique: GERD diet office visit in 2 weeks, sooner if needed with all active medications/ inhalers in hand    08/24/2015  f/u ov/Maura Braaten re: asthma/ ? Sinus dz/ no meds  Chief Complaint  Patient presents with  . Follow-up    No longer wheezing and coughing less. He still c/o nasal congestion and has had sinus HA.   Gaylyn Rong is q afteroon localized above R eye radiates to temple,  Resolves over night, drinks lots of coffee  - nasal congestion improves with steroids  sob and cough better on symbicort 80 2bid  No obvious day to day  or daytime variabilty or assoc excess or purulent secretions or  cp or chest tightness, overt  hb symptoms. No unusual exp hx or h/o childhood pna/ asthma or knowledge of premature birth.  Sleeping ok without nocturnal  or early am exacerbation  of respiratory  c/o's or need for noct saba. Also denies any obvious fluctuation of symptoms with weather or environmental changes or other aggravating or alleviating factors except as outlined above   Current Medications, Allergies, Complete Past Medical History, Past Surgical History, Family History, and Social History were reviewed in [name] record.  ROS  The following are not active complaints unless bolded sore throat, dysphagia, dental problems, itching, sneezing,  nasal congestion or excess/ purulent secretions, ear ache,   fever, chills, sweats, unintended wt loss, pleuritic or exertional cp, hemoptysis,  orthopnea pnd or leg swelling, presyncope, palpitations, abdominal pain,  anorexia, nausea, vomiting, diarrhea  or change in bowel or urinary habits, change in stools or urine, dysuria,hematuria,  rash, arthralgias, visual complaints, headache, numbness weakness or ataxia or problems with walking or coordination,  change in mood/affect or memory.          Objective:   Physical Exam  amb latino male nad     05/25/2015         165 > 08/09/2015   167 > 08/24/2015 165  Wt Readings from Last 3 Encounters:  05/11/15 164 lb 3.2 oz (74.481 kg)  05/05/13 163 lb 10.1 oz (74.222 kg)  06/02/11 167 lb 12.8 oz (76.114 kg)    Vital signs reviewed  HEENT: nl dentition,  and orophanx. Nl external ear canals without cough reflex - severe  non-specific bilateral  turbinate edema    NECK :  without JVD/Nodes/TM/ nl carotid upstrokes bilaterally   LUNGS: no acc muscle use, clear to  A and P s cough on insp / exp    CV:  RRR  no s3 or murmur or increase in P2, no edema   ABD:  soft and nontender with nl excursion in the supine position. No bruits or organomegaly, bowel sounds nl  MS:  warm without deformities, calf tenderness, cyanosis or clubbing  SKIN: warm and dry without lesions    NEURO:  alert, approp, no deficits    CXR PA and Lateral:   05/11/2015 :     I personally reviewed images and agree with radiology impression as follows:    The heart size and mediastinal contours are within normal limits. Both lungs are clear. The visualized skeletal structures are unremarkable.       Assessment & Plan:

## 2015-08-24 NOTE — Patient Instructions (Addendum)
Prednisone 10 mg take  4 each am x 2 days,   2 each am x 2 days,  1 each am x 2 days and stop  Please see patient coordinator before you leave today  to schedule sinus CT  Try to eliminate caffeine if possible from diet  Please schedule a follow up office visit in 6 weeks, call sooner if needed with all active medications in hand

## 2015-08-25 ENCOUNTER — Encounter: Payer: Self-pay | Admitting: Internal Medicine

## 2015-08-25 DIAGNOSIS — J309 Allergic rhinitis, unspecified: Secondary | ICD-10-CM | POA: Insufficient documentation

## 2015-08-25 NOTE — Assessment & Plan Note (Signed)
Suspect this may be playing a major role in his asthma/ may need allergy eval next but start with sinus CT

## 2015-08-25 NOTE — Assessment & Plan Note (Signed)
-  05/11/2015 d/c singulair/ proair/ tessalon as not working  - 05/11/2015   try dulera 100 2bid - allergy profile 05/11/2015 > Eos 0.5,  IgE 208  Pos RAST Grass/trees/ ragweed - 08/09/2015 p extensive coaching HFA effectiveness =    90%   - add singulair back 08/24/2015  - sinus CT 08/24/2015   All goals of chronic asthma control met including optimal function and elimination of symptoms with minimal need for rescue therapy.  Contingencies discussed in full including contacting this office immediately if not controlling the symptoms using the rule of two's.     Next need to address rhinitis- add singulair back  (see sep a/p)

## 2015-09-02 ENCOUNTER — Ambulatory Visit (INDEPENDENT_AMBULATORY_CARE_PROVIDER_SITE_OTHER)
Admission: RE | Admit: 2015-09-02 | Discharge: 2015-09-02 | Disposition: A | Discharge status: Home / Self Care | Payer: BLUE CROSS/BLUE SHIELD | Source: Ambulatory Visit | Attending: Internal Medicine | Admitting: Internal Medicine

## 2015-09-02 DIAGNOSIS — J309 Allergic rhinitis, unspecified: Secondary | ICD-10-CM

## 2015-09-02 DIAGNOSIS — J45991 Cough variant asthma: Secondary | ICD-10-CM | POA: Diagnosis not present

## 2015-09-02 NOTE — Progress Notes (Signed)
Quick Note:  LMTCB ______ 

## 2015-09-06 NOTE — Progress Notes (Signed)
Quick Note:  LMTCB x 3 ______

## 2015-09-07 ENCOUNTER — Other Ambulatory Visit: Payer: Self-pay | Admitting: Internal Medicine

## 2015-09-07 MED ORDER — FLUTICASONE PROPIONATE 50 MCG/ACT NA SUSP: 1.0000 | Freq: Two times a day (BID) | NASAL | Status: DC

## 2015-10-05 ENCOUNTER — Encounter: Payer: Self-pay | Admitting: Internal Medicine

## 2015-10-05 ENCOUNTER — Ambulatory Visit (INDEPENDENT_AMBULATORY_CARE_PROVIDER_SITE_OTHER): Payer: BLUE CROSS/BLUE SHIELD | Admitting: Internal Medicine

## 2015-10-05 ENCOUNTER — Telehealth: Payer: Self-pay

## 2015-10-05 VITALS — BP 132/88 | HR 78 | Ht 65.0 in | Wt 163.4 lb

## 2015-10-05 DIAGNOSIS — J45991 Cough variant asthma: Secondary | ICD-10-CM | POA: Diagnosis not present

## 2015-10-05 DIAGNOSIS — J309 Allergic rhinitis, unspecified: Secondary | ICD-10-CM

## 2015-10-05 LAB — NITRIC OXIDE: NITRIC OXIDE: 63

## 2015-10-05 MED ORDER — BUDESONIDE-FORMOTEROL FUMARATE 160-4.5 MCG/ACT IN AERO: INHALATION_SPRAY | RESPIRATORY_TRACT | Status: DC

## 2015-10-05 NOTE — Progress Notes (Signed)
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 Pulmonary Consult    Self referral. Pt c/o cough x 2 yrs- progressively getting worse. Cough is prod with minimal yellow/blood streaked sputum. Cough is esp worse at night.   cough x 2 years daily worst at hs and also when gets hot every single day and worse in am better with abx/prednisone and no better with gerd rx/nasal steroids saba and tessalon also no help at all. rec Change Prilosec 20 mg x 2 x 30 min before 1st meal and  Add pepcid ac 20 one at bedtime  Augmentin 875 mg take one pill twice daily  X 10 days - .  Prednisone 10 mg take  4 each am x 2 days,   2 each am x 2 days,  1 each am x 2 days and stop  Start dulera 100 Take 2 puffs first thing in am and then another 2 puffs about 12 hours later Stop singulair and proair and tessilon  GERD  Diet      05/25/2015 f/u ov/Wert re: asthma on dulera 100 2bid and gerd rx  Chief Complaint  Patient presents with  . Follow-up    Cough is much improved. No new co's today.   Not limited by breathing from desired activities  / some persistent nasal congestion rec symbicort 80 Take 2 puffs first thing in am and then another 2 puffs about 12 hours later Work on inhaler technique:  Ok to try off gerd rx    08/09/2015 acute extended ov/Wert re: much worse  X 3 weeks  Chief Complaint  Patient presents with  . Acute Visit    Pt c/o increased cough and wheezing x 2-3 wks, prod with yellow sputum. He also c/o sinus pressure   Was all better p last ov/ denies missing doses /  hfa not optimal (see a/p)  Onset was gradual, minimally progressive, worse at hs  rec Protonix 40  before 1st meal and  Add pepcid ac 20 one at bedtime  Augmentin 875 mg take one pill twice daily  X 10 days - Prednisone 10 mg take  4 each am x 2 days,   2 each am x 2 days,  1 each am x 2 days and stop  Continue symbicort 80 Take 2 puffs first thing in am and then another 2 puffs about 12 hours later.  Work on inhaler technique: GERD diet office visit in 2 weeks, sooner if needed with all active medications/ inhalers in hand    08/24/2015  f/u ov/Wert re: asthma/ ? Sinus dz/ no meds  Chief Complaint  Patient presents with  . Follow-up    No longer wheezing and coughing less. He still c/o nasal congestion and has had sinus HA.   Gaylyn Rong is q afteroon localized above R eye radiates to temple,  Resolves over night, drinks lots of coffee  - nasal congestion improves with steroids sob and cough better on symbicort 80 2bid  rec Prednisone 10 mg take  4 each am x 2 days,   2 each am x 2 days,  1 each am x 2 days and stop  schedule sinus CT> neg  Try to eliminate caffeine if possible from diet Please schedule a follow up office visit in 6 weeks, call sooner if needed with all active medications in hand     10/05/2015  f/u ov/Wert re: allergic asthma/ rhinitis  Chief Complaint  Patient presents with  . Follow-up    Pt c/o occasional cough that is sometimes productive with yellow mucus in the mornings. Pt denies SOB/CP/tightness.   some am cough /  Can't take flonase   No obvious day to day or daytime variabilty or assoc excess or purulent secretions or  cp or chest tightness, overt  hb symptoms. No unusual exp hx or h/o childhood pna/ asthma or knowledge of premature birth.  Sleeping ok without nocturnal  or early am exacerbation  of respiratory  c/o's or need for noct saba. Also denies any obvious fluctuation of symptoms with weather or environmental changes or other aggravating or alleviating  factors except as outlined above   Current Medications, Allergies, Complete Past Medical History, Past Surgical History, Family History, and Social History were reviewed in [facility] electronic medical record.  ROS  The following are not active complaints unless bolded sore throat, dysphagia, dental problems, itching, sneezing,  nasal congestion or excess/ purulent secretions, ear ache,   fever, chills, sweats, unintended wt loss, pleuritic or exertional cp, hemoptysis,  orthopnea pnd or leg swelling, presyncope, palpitations, abdominal pain, anorexia, nausea, vomiting, diarrhea  or change in bowel or urinary habits, change in stools or urine, dysuria,hematuria,  rash, arthralgias, visual complaints, headache, numbness weakness or ataxia or problems with walking or coordination,  change in mood/affect or memory.          Objective:   Physical Exam  amb latino male nad     05/25/2015         165 > 08/09/2015   167 > 08/24/2015 165 > 10/05/2015 163    05/11/15 164 lb 3.2 oz (74.481 kg)  05/05/13 163 lb 10.1 oz (74.222 kg)  06/02/11 167 lb 12.8 oz (76.114 kg)    Vital signs reviewed  HEENT: nl dentition,  and orophanx. Nl external ear canals without cough reflex - severe  non-specific bilateral  turbinate edema    NECK :  without JVD/Nodes/TM/ nl carotid upstrokes bilaterally   LUNGS: no acc muscle use, trace insp squeaks and pops both bases, min exp rhonchi with fvc   CV:  RRR  no s3 or murmur or increase in P2, no edema   ABD:  soft and nontender with nl excursion in the supine position. No bruits or organomegaly, bowel sounds nl  MS:  warm without deformities, calf tenderness, cyanosis or clubbing  SKIN: warm and dry without lesions    NEURO:  alert, approp, no deficits    CXR PA and Lateral:   05/11/2015 :     I personally reviewed images and agree with radiology impression as follows:    The heart size and mediastinal contours are within normal limits. Both lungs are  clear. The visualized skeletal structures are unremarkable.       Assessment & Plan:

## 2015-10-05 NOTE — Patient Instructions (Addendum)
nasacort aq is worth trying over the counter for your nasal symptoms and is over the counter   Try symbicort 160 Take 2 puffs first thing in am and then another 2 puffs about 12 hours later if all better fill the prescription, if not call for referral to allergy    Pulmonary follow up is as needed  Late add   baseline pfts  p 2 weeks on symbicort 160 - [facility] be [facility] if [facility] don[facility] have an opening here

## 2015-10-06 ENCOUNTER — Encounter: Payer: Self-pay | Admitting: Internal Medicine

## 2015-10-06 ENCOUNTER — Ambulatory Visit (INDEPENDENT_AMBULATORY_CARE_PROVIDER_SITE_OTHER): Payer: BLUE CROSS/BLUE SHIELD | Admitting: Internal Medicine

## 2015-10-06 VITALS — BP 112/70 | HR 71 | Temp 97.6°F | Ht 65.0 in | Wt 163.1 lb

## 2015-10-06 DIAGNOSIS — Z09 Encounter for follow-up examination after completed treatment for conditions other than malignant neoplasm: Secondary | ICD-10-CM

## 2015-10-06 DIAGNOSIS — K4090 Unilateral inguinal hernia, without obstruction or gangrene, not specified as recurrent: Secondary | ICD-10-CM | POA: Diagnosis not present

## 2015-10-06 DIAGNOSIS — J4520 Mild intermittent asthma, uncomplicated: Secondary | ICD-10-CM

## 2015-10-06 DIAGNOSIS — Z23 Encounter for immunization: Secondary | ICD-10-CM

## 2015-10-06 NOTE — Patient Instructions (Signed)
Get your blood work before you leave   we are referring you to surgery  Please bring a copy of your colonoscopy when you come back   Next visit  for a physical exam in 3 months, fasting    Please schedule an appointment at the front desk

## 2015-10-06 NOTE — Progress Notes (Signed)
Subjective:    Patient ID: Keith Long, male    DOB: 1961-05-08, 54 y.o.   MRN: [id number]  DOS:  10/06/2015 Type of visit - description : Here to get established, new patient Interval history:  General feeling well, good compliance of medications. Has a history of a hernia, has increased in size lately and is causing pain from time to time. Would like a referral to general surgery.   Review of Systems  Denies fever chills No chest pain, difficulty breathing or lower extremity edema Currently with no nausea vomiting or diarrhea. Occasionally has pain at the groin area, right side where he has a hernia. No penile discharge or dysuria.  Past Medical History  Diagnosis Date  . Hemorrhoids   . Rectal bleeding   . Asthma     Dr. Sherene Sires    Past Surgical History  Procedure Laterality Date  . Hemorrhoid surgery      local injection  . Nose surgery  1992    Social History   Social History  . Marital Status: Married    Spouse Name: N/A  . Number of Children: 3  . Years of Education: N/A   Occupational History  . 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 Smoking status: Never Smoker   . Smokeless tobacco: Not on file  . Alcohol Use: Yes     Comment: occasional  . Drug Use: No  . Sexual Activity: Not on file   Other Topics Concern  . Not on file   Social History Narrative   [place] from [place]   Divorced, lives w/ g-friend    3 daughters         Medication List       This list is accurate as of: 10/06/15 11:59 PM.  Always use your most recent med list.               budesonide-formoterol 160-4.5 MCG/ACT inhaler  Commonly known as:  SYMBICORT  Take 2 puffs first thing in am and then another 2 puffs about 12 hours later.     famotidine 20 MG tablet  Commonly known as:  PEPCID  One at bedtime     montelukast 10 MG tablet  Commonly known as:  SINGULAIR  Take 1 tablet (10 mg total) by mouth at bedtime.     pantoprazole 40  MG tablet  Commonly known as:  PROTONIX  Take 1 tablet (40 mg total) by mouth daily. Take 30-60 min before first meal of the day           Objective:   Physical Exam BP 112/70 mmHg  Pulse 71  Temp(Src) 97.6 F (36.4 C) (Oral)  Ht  (1.651 m)  Wt 163 lb 2 oz (73.993 kg)  BMI 27.15 kg/m2  SpO2 94% General:   Well developed, well nourished . NAD.  HEENT:  Normocephalic . Face symmetric, atraumatic Lungs:  CTA B Normal respiratory effort, no intercostal retractions, no accessory muscle use. Heart: RRR,  no murmur.  no pretibial edema bilaterally  Abdomen:  Not distended, soft, non-tender. No rebound or rigidity.  + Reducible hernia at the right inguinal area, more noticeable with Valsalva. Very small similar finding on the left? GU: Penis without discharge, scrotal contents normal. Skin: Not pale. Not jaundice Neurologic:  alert & oriented X3.  Speech normal, gait appropriate for age and unassisted Psych--  Cognition and judgment appear intact.  Cooperative with normal attention span and  concentration.  Behavior appropriate. No anxious or depressed appearing.    Assessment & Plan:   Assessment > Hyperlipidemia Asthma -- Dr Sherene SiresWert Hemorrhoids status post local injections and a colonoscopy 2014. Still has occasional bleeding  PLAN Right inguinal hernia, he was evaluated by Dr. Gerrit FriendsGerkin 2014 but at the time declined surgery. He is now more symptomatic. Refer to surgery again. ER if symptoms severe. Will get CMP and CBC Asthma: Currently well controlled, sees pulmonology. Not taking Singulair or Protonix. Primary care: Pneumonia shot provided today, recommend to get the copy of the colonoscopy he had in 2013, RTC 3 months for a physical exam, fasting

## 2015-10-06 NOTE — Progress Notes (Signed)
Pre visit review using our clinic review tool, if applicable. No additional management support is needed unless otherwise documented below in the visit note. 

## 2015-10-07 DIAGNOSIS — Z09 Encounter for follow-up examination after completed treatment for conditions other than malignant neoplasm: Secondary | ICD-10-CM | POA: Insufficient documentation

## 2015-10-07 LAB — COMPREHENSIVE METABOLIC PANEL
ALBUMIN: 4 g/dL (ref 3.5–5.2)
ALT: 34 U/L (ref 0–53)
AST: 23 U/L (ref 0–37)
Alkaline Phosphatase: 78 U/L (ref 39–117)
BILIRUBIN TOTAL: 0.7 mg/dL (ref 0.2–1.2)
BUN: 17 mg/dL (ref 6–23)
CALCIUM: 9.7 mg/dL (ref 8.4–10.5)
CHLORIDE: 105 meq/L (ref 96–112)
CO2: 29 mEq/L (ref 19–32)
CREATININE: 0.82 mg/dL (ref 0.40–1.50)
GFR: 104.9 mL/min (ref >60.00)
Glucose, Bld: 111 mg/dL — ABNORMAL HIGH (ref 70–99)
Potassium: 4 mEq/L (ref 3.5–5.1)
SODIUM: 141 meq/L (ref 135–145)
Total Protein: 7.2 g/dL (ref 6.0–8.3)

## 2015-10-07 LAB — CBC WITH DIFFERENTIAL/PLATELET
BASOS ABS: 0.1 10*3/uL (ref 0.0–0.1)
BASOS PCT: 1.8 % (ref 0.0–3.0)
EOS ABS: 0.2 10*3/uL (ref 0.0–0.7)
Eosinophils Relative: 3.7 % (ref 0.0–5.0)
HCT: 39.2 % (ref 39.0–52.0)
HEMOGLOBIN: 13 g/dL (ref 13.0–17.0)
LYMPHS PCT: 32.1 % (ref 12.0–46.0)
Lymphs Abs: 2.2 10*3/uL (ref 0.7–4.0)
MCHC: 33.2 g/dL (ref 30.0–36.0)
MCV: 86.3 fl (ref 78.0–100.0)
MONO ABS: 0.4 10*3/uL (ref 0.1–1.0)
Monocytes Relative: 6.1 % (ref 3.0–12.0)
NEUTROS ABS: 3.8 10*3/uL (ref 1.4–7.7)
Neutrophils Relative %: 56.3 % (ref 43.0–77.0)
PLATELETS: 299 10*3/uL (ref 150.0–400.0)
RBC: 4.54 Mil/uL (ref 4.22–5.81)
RDW: 15.2 % (ref 11.5–15.5)
WBC: 6.8 10*3/uL (ref 4.0–10.5)

## 2015-10-07 NOTE — Assessment & Plan Note (Signed)
Right inguinal hernia, he was evaluated by Dr. Gerrit FriendsGerkin 2014 but at the time declined surgery. He is now more symptomatic. Refer to surgery again. ER if symptoms severe. Will get CMP and CBC Asthma: Currently well controlled, sees pulmonology. Not taking Singulair or Protonix. Primary care: Pneumonia shot provided today, recommend to get the copy of the colonoscopy he had in 2013, RTC 3 months for a physical exam, fasting

## 2015-10-10 ENCOUNTER — Encounter: Payer: Self-pay | Admitting: Internal Medicine

## 2015-10-10 NOTE — Assessment & Plan Note (Addendum)
-   05/11/2015 d/c singulair/ proair/ tessalon as not working  - 05/11/2015   try dulera 100 2bid - allergy profile 05/11/2015 > Eos 0.5,  IgE 208  Pos RAST Grass/trees/ ragweed - 08/09/2015 p extensive coaching HFA effectiveness =    90%   - add singulair back 08/24/2015   - increased symbicort to 160 2bid   He is much better at this point but still has some residual pops and squeaks on exam so we will increase the inhaled steroid to the Symbicort 160 strength and consider referring him to allergy for his sinus symptoms as he clearly has atopy with more upper than lower airway issues. Does need  baseline pfts  p 2 weeks on symbicort 160 - [facility] have an opening here    I had an extended final summary discussion with the patient reviewing all relevant studies completed to date and  lasting 15 to 20 minutes of a 25 minute visit on the following issues:    Each maintenance medication was reviewed in detail including most importantly the difference between maintenance and as needed and under what circumstances the prns are to be used.  Please see instructions for details which were reviewed in writing and the patient given a copy.

## 2015-10-10 NOTE — Assessment & Plan Note (Signed)
-   allergy profile 05/11/2015 > Eos 0.5,  IgE 208  Pos RAST Grass/trees/ ragweed - Restarted singulair 08/24/2015  - Sinus CT 09/02/2015 1. No definite sinusitis. 2. Mild mucosal thickening in the floor of the left maxillary sinus. 3. Compromise of the nasal airway by prominent terminates and probable nasal mucosal edema. - could not tol flonase > 10/05/2015 rec nasacort AQ and consider allergy referral

## 2015-10-11 ENCOUNTER — Telehealth: Payer: Self-pay | Admitting: *Deleted

## 2015-10-11 DIAGNOSIS — J45991 Cough variant asthma: Secondary | ICD-10-CM

## 2015-10-11 NOTE — Telephone Encounter (Signed)
Spoke with the pt and notified of recs per MW  He verbalized understanding  Order sent to [facility] 

## 2015-10-11 NOTE — Telephone Encounter (Signed)
Pre visit information completed. 

## 2015-10-11 NOTE — Telephone Encounter (Signed)
-----   Message from Nyoka CowdenMichael B Wert, MD sent at 10/10/2015  6:29 AM EST -----  baseline pfts  p 2 weeks on symbicort 160 - [facility] have an opening here

## 2015-10-19 ENCOUNTER — Ambulatory Visit (HOSPITAL_COMMUNITY): Payer: BLUE CROSS/BLUE SHIELD

## 2015-10-22 ENCOUNTER — Ambulatory Visit (HOSPITAL_COMMUNITY)
Admission: RE | Admit: 2015-10-22 | Discharge: 2015-10-22 | Disposition: A | Discharge status: Home / Self Care | Payer: BLUE CROSS/BLUE SHIELD | Source: Ambulatory Visit | Attending: Internal Medicine | Admitting: Internal Medicine

## 2015-10-22 DIAGNOSIS — J45991 Cough variant asthma: Secondary | ICD-10-CM | POA: Diagnosis not present

## 2015-10-22 LAB — PULMONARY FUNCTION TEST
DL/VA % pred: 124 %
DL/VA: 5.35 ml/min/mmHg/L
DLCO UNC: 28.34 ml/min/mmHg
DLCO cor % pred: 116 %
DLCO cor: 29.78 ml/min/mmHg
DLCO unc % pred: 110 %
FEF 25-75 Post: 5.17 L/sec
FEF 25-75 Pre: 4.07 L/sec
FEF2575-%Change-Post: 26 %
FEF2575-%PRED-POST: 176 %
FEF2575-%Pred-Pre: 139 %
FEV1-%CHANGE-POST: 5 %
FEV1-%PRED-POST: 114 %
FEV1-%Pred-Pre: 108 %
FEV1-PRE: 3.54 L
FEV1-Post: 3.75 L
FEV1FVC-%Change-Post: 4 %
FEV1FVC-%Pred-Pre: 109 %
FEV6-%Change-Post: 0 %
FEV6-%PRED-PRE: 104 %
FEV6-%Pred-Post: 105 %
FEV6-POST: 4.25 L
FEV6-PRE: 4.22 L
FEV6FVC-%PRED-POST: 104 %
FEV6FVC-%Pred-Pre: 104 %
FVC-%CHANGE-POST: 0 %
FVC-%PRED-POST: 101 %
FVC-%PRED-PRE: 100 %
FVC-POST: 4.26 L
FVC-PRE: 4.22 L
POST FEV6/FVC RATIO: 100 %
PRE FEV6/FVC RATIO: 100 %
Post FEV1/FVC ratio: 88 %
Pre FEV1/FVC ratio: 84 %
RV % PRED: 118 %
RV: 2.14 L
TLC % PRED: 104 %
TLC: 6.22 L

## 2015-10-22 MED ORDER — ALBUTEROL SULFATE (2.5 MG/3ML) 0.083% IN NEBU
2.5000 mg | INHALATION_SOLUTION | Freq: Once | RESPIRATORY_TRACT | Status: AC
  Administered 2015-10-22: 2.5 mg via RESPIRATORY_TRACT

## 2016-02-04 ENCOUNTER — Encounter: Payer: Self-pay | Admitting: Internal Medicine

## 2016-02-04 ENCOUNTER — Ambulatory Visit (INDEPENDENT_AMBULATORY_CARE_PROVIDER_SITE_OTHER): Payer: Self-pay | Admitting: Internal Medicine

## 2016-02-04 ENCOUNTER — Ambulatory Visit (HOSPITAL_BASED_OUTPATIENT_CLINIC_OR_DEPARTMENT_OTHER)
Admission: RE | Admit: 2016-02-04 | Discharge: 2016-02-04 | Disposition: A | Discharge status: Home / Self Care | Payer: Self-pay | Source: Ambulatory Visit | Attending: Internal Medicine | Admitting: Internal Medicine

## 2016-02-04 VITALS — BP 132/78 | HR 80 | Temp 97.9°F | Ht 65.0 in | Wt 158.5 lb

## 2016-02-04 DIAGNOSIS — R634 Abnormal weight loss: Secondary | ICD-10-CM

## 2016-02-04 DIAGNOSIS — R222 Localized swelling, mass and lump, trunk: Secondary | ICD-10-CM | POA: Insufficient documentation

## 2016-02-04 DIAGNOSIS — J45991 Cough variant asthma: Secondary | ICD-10-CM

## 2016-02-04 DIAGNOSIS — Z09 Encounter for follow-up examination after completed treatment for conditions other than malignant neoplasm: Secondary | ICD-10-CM

## 2016-02-04 DIAGNOSIS — R059 Cough, unspecified: Secondary | ICD-10-CM

## 2016-02-04 DIAGNOSIS — R05 Cough: Secondary | ICD-10-CM | POA: Insufficient documentation

## 2016-02-04 MED ORDER — AZITHROMYCIN 250 MG PO TABS: ORAL_TABLET | ORAL | Status: DC

## 2016-02-04 NOTE — Progress Notes (Signed)
Pre visit review using our clinic review tool, if applicable. No additional management support is needed unless otherwise documented below in the visit note. 

## 2016-02-04 NOTE — Progress Notes (Signed)
Subjective:    Patient ID: Keith Long, male    DOB: [birth year], 55 y.o.   MRN: [id number]  DOS:  02/04/2016 Type of visit - description : Acute visit  Interval history: Not feeling well for the last 2 weeks: Subjective fever, increased cough with yellow sputum production. Some upper  back pain with cough.  Also, he stop all medication several months ago, states: " I'm not sure I have asthma, I think is more allergies, my sinuses are always congested"   Review of Systems Admits to itchy eyes, nose. + Sneezing. No difficulty breathing + wheezing in the last few weeks. When asked, admits to few drops of red blood in the sputum few times in the last 2 weeks  Wt Readings from Last 3 Encounters:  02/04/16 158 lb 8 oz (71.895 kg)  10/06/15 163 lb 2 oz (73.993 kg)  10/05/15 163 lb 6.4 oz (74.118 kg)     Past Medical History  Diagnosis Date  . Hemorrhoids   . Rectal bleeding   . Asthma     Dr. Sherene SiresWert  . Right inguinal hernia 2016    Dr. Sheliah HatchKinsinger    Past Surgical History  Procedure Laterality Date  . Hemorrhoid surgery      local injection  . Nose surgery  1992    Social History   Social History  . Marital Status: Married    Spouse Name: N/A  . Number of Children: 3  . Years of Education: N/A   Occupational History  . 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 Smoking status: Never Smoker   . Smokeless tobacco: Not on file  . Alcohol Use: Yes     Comment: occasional  . Drug Use: No  . Sexual Activity: Not on file   Other Topics Concern  . Not on file   Social History Narrative   Original from [place]   Divorced, lives w/ g-friend    3 daughters         Medication List       This list is accurate as of: 02/04/16 11:59 PM.  Always use your most recent med list.               albuterol 108 (90 Base) MCG/ACT inhaler  Commonly known as:  PROVENTIL HFA;VENTOLIN HFA  Inhale 2 puffs into the lungs every 6 (six) hours as needed  for wheezing or shortness of breath.     azithromycin 250 MG tablet  Commonly known as:  ZITHROMAX Z-PAK  2 tabs a day the first day, then 1 tab a day x 4 days     montelukast 10 MG tablet  Commonly known as:  SINGULAIR  Take 1 tablet (10 mg total) by mouth at bedtime.           Objective:   Physical Exam BP 132/78 mmHg  Pulse 80  Temp(Src) 97.9 F (36.6 C) (Oral)  Ht 5\' 5"  (1.651 m)  Wt 158 lb 8 oz (71.895 kg)  BMI 26.38 kg/m2  SpO2 95% General:   Well developed, well nourished . NAD.  HEENT:  Normocephalic . Face symmetric, atraumatic. TMs normal, throat symmetric, no red. Nose is slightly congested, sinuses not TTP. Neck: No LAD is  Lungs:  CTA B Normal respiratory effort, no intercostal retractions, no accessory muscle use. Heart: RRR,  no murmur.  No pretibial edema bilaterally  Skin: Not pale. Not jaundice Neurologic:  alert & oriented X3.  Speech normal, gait appropriate for age and unassisted Psych--  Cognition and judgment appear intact.  Cooperative with normal attention span and concentration.  Behavior appropriate. No anxious or depressed appearing.      Assessment & Plan:  Assessment > Hyperlipidemia Cough variant Asthma -- Dr Sherene Sires Nl PFTs 10-2015  - allergy profile 05/11/2015 > Eos 0.5, IgE 208 Pos RAST Grass/trees/ ragweed - Restarted singulair 08/24/2015  - Sinus CT 09/02/2015 1. No definite sinusitis. H/o  flonase intolerance Allergic rhinitis Hemorrhoids status post local injections and a colonoscopy 2014. Still has occasional bleeding  PLAN Cough variant asthma, rhinitis Two-week history of increased cough, sputum production, and ? of hemoptysis. Also reports chronic sinus congestion, likely allergic rhinitis Currently not taking any medication as he believes he does not have asthma. Plan: Chest x-ray due to hemoptysis Z-Pak,Zyrtec, OTC Nasacort, Mucinex, albuterol as needed, singulair See AVS  Weight loss noted: Reports that he  lost his job, is under a lot of stress. has an appointment in April, I advised the patient to keep it to keep monitoring the issue. RTC 02-2016 as schedule

## 2016-02-04 NOTE — Patient Instructions (Addendum)
Rest, fluids , tylenol  For cough:  Take Mucinex DM twice a day as needed until better  For nasal congestion: Use OTC Nasocort  : 2 nasal sprays on each side of the nose in the morning until you feel better   Zyrtec OTC 1 tablet daily every night  Restart singular one tablet every morning  Use albuterol as needed if persisting cough or wheezing.  Take the antibiotic as prescribed: Zithromax  Call if not gradually better over the next  10 days  Call anytime if the symptoms are severe    STOP BY THE FIRST FLOOR:  get the XR    See you in April

## 2016-02-05 NOTE — Assessment & Plan Note (Signed)
Cough variant asthma, rhinitis Two-week history of increased cough, sputum production, and ? of hemoptysis. Also reports chronic sinus congestion, likely allergic rhinitis Currently not taking any medication as he believes he does not have asthma. Plan: Chest x-ray due to hemoptysis Z-Pak,Zyrtec, OTC Nasacort, Mucinex, albuterol as needed, singulair See AVS  Weight loss noted: Reports that he lost his job, is under a lot of stress. has an appointment in April, I advised the patient to keep it to keep monitoring the issue. RTC 02-2016 as schedule

## 2016-02-08 ENCOUNTER — Other Ambulatory Visit: Payer: Self-pay | Admitting: Internal Medicine

## 2016-02-08 DIAGNOSIS — R9389 Abnormal findings on diagnostic imaging of other specified body structures: Secondary | ICD-10-CM

## 2016-02-10 ENCOUNTER — Telehealth: Payer: Self-pay | Admitting: Internal Medicine

## 2016-02-10 ENCOUNTER — Other Ambulatory Visit (HOSPITAL_COMMUNITY): Payer: Self-pay | Admitting: Occupational Therapy

## 2016-02-10 NOTE — Telephone Encounter (Signed)
I spoke with the patient, he has no insurance and is concerned about the cost of the CT. Chart reviewed, chest x-ray from 02/04/2016 show a left chest nodule, may be associated with a rib head, CT is recommended. Previously had a right lung nodule, see CT from 2013. This is a new problem, he does need a CAT scan but he likes to hold off for now until his insurance kick in. He will come back here in April and we will discuss more.

## 2016-03-01 ENCOUNTER — Telehealth: Payer: Self-pay

## 2016-03-01 NOTE — Telephone Encounter (Signed)
Pre-Visit call made to patient. Left message for return call. 

## 2016-03-02 ENCOUNTER — Encounter: Payer: Self-pay | Admitting: Internal Medicine

## 2016-03-30 ENCOUNTER — Encounter: Payer: Self-pay | Admitting: Internal Medicine

## 2016-03-30 ENCOUNTER — Telehealth: Payer: Self-pay | Admitting: Internal Medicine

## 2016-04-10 NOTE — Telephone Encounter (Signed)
Pt was no show 03/30/16 for cpe, 1st no show + 1 cancellation, pt has not rescheduled, charge or no charge?

## 2016-04-10 NOTE — Telephone Encounter (Signed)
No Charge 

## 2017-02-20 ENCOUNTER — Ambulatory Visit (INDEPENDENT_AMBULATORY_CARE_PROVIDER_SITE_OTHER): Payer: BLUE CROSS/BLUE SHIELD | Admitting: Family Medicine

## 2017-02-20 VITALS — BP 130/88 | HR 73 | Temp 98.2°F | Resp 14 | Ht 65.0 in | Wt 163.4 lb

## 2017-02-20 DIAGNOSIS — R911 Solitary pulmonary nodule: Secondary | ICD-10-CM | POA: Diagnosis not present

## 2017-02-20 DIAGNOSIS — R0982 Postnasal drip: Secondary | ICD-10-CM

## 2017-02-20 DIAGNOSIS — J45991 Cough variant asthma: Secondary | ICD-10-CM

## 2017-02-20 DIAGNOSIS — R062 Wheezing: Secondary | ICD-10-CM | POA: Diagnosis not present

## 2017-02-20 DIAGNOSIS — J301 Allergic rhinitis due to pollen: Secondary | ICD-10-CM

## 2017-02-20 MED ORDER — FLUTICASONE PROPIONATE 50 MCG/ACT NA SUSP: 2.0000 | Freq: Every day | NASAL | 6 refills | Status: DC

## 2017-02-20 MED ORDER — FLUTICASONE PROPIONATE HFA 44 MCG/ACT IN AERO: 2.0000 | INHALATION_SPRAY | Freq: Two times a day (BID) | RESPIRATORY_TRACT | 3 refills | Status: DC

## 2017-02-20 MED ORDER — CETIRIZINE HCL 10 MG PO TABS: 10.0000 mg | ORAL_TABLET | Freq: Every day | ORAL | 11 refills | Status: DC

## 2017-02-20 MED ORDER — ALBUTEROL SULFATE HFA 108 (90 BASE) MCG/ACT IN AERS: 1.0000 | INHALATION_SPRAY | RESPIRATORY_TRACT | 0 refills | Status: DC | PRN

## 2017-02-20 NOTE — Progress Notes (Addendum)
Subjective:  By signing my name below, I, Essence Howell, attest that this documentation has been prepared under the direction and in the presence of Shade Flood, MD Electronically Signed: Charline Bills, ED Scribe 02/20/2017 at 6:13 PM.   Patient ID: Keith Long, male    DOB: 01-02-1961, 56 y.o.   MRN: [id number]  Chief Complaint  Patient presents with  . Asthma  . Sinus Problem    Cough, Drainage, Coughing causes back to hurt   HPI Keith Long is a 56 y.o. male who presents to Primary Care at [facility] with a h/o asthma treated with albuterol. Singular as needed. PCP: Willow Ora, MD. Pt has been treated for cough variant asthma in 2017. Had been off medications for some time at that point. Did have some positive allergy testing for grass, trees and ragweed. Previous records viewed as part of his visit. Lung nodule on previous CT scan but wanted to defer repeat testing until insurance coverage. Plan to discus this further next week.   Pt reports cough that wakes him at night for the past few months. He reports associated symptoms of nasal congestion, postnasal drip and wheezing as well. He has taken Flonase in the past. Pt denies fever and rhinorrhea.   Patient Active Problem List   Diagnosis Date Noted  . PCP NOTES >>>>> 10/07/2015  . Chronic allergic rhinitis 08/25/2015  . Cough variant asthma 05/11/2015  . Inguinal hernia unilateral, non-recurrent, right 05/05/2013  . HEMATURIA, MICROSCOPIC, HX OF 08/02/2008  . LATERAL EPICONDYLITIS, RIGHT 07/30/2008  . DYSURIA 07/30/2008  . RECTAL BLEEDING, HX OF 07/30/2008  . TENDINITIS, LEFT KNEE 02/26/2007  . HYPERLIPIDEMIA 01/25/2007  . FATIGUE 01/25/2007  . HEMORRHOIDS, NOS 01/17/2007  . GASTROESOPHAGEAL REFLUX, NO ESOPHAGITIS 01/17/2007   Past Medical History:  Diagnosis Date  . Allergy   . Anemia   . Arthritis   . Asthma    Dr. Sherene Sires  . Hemorrhoids   . Rectal bleeding   . Right inguinal hernia 2016   Dr. Sheliah Hatch   Past  Surgical History:  Procedure Laterality Date  . HEMORRHOID SURGERY     local injection  . NOSE SURGERY  1992   Allergies  Allergen Reactions  . Augmentin [Amoxicillin-Pot Clavulanate] Nausea Only   Prior to Admission medications   Medication Sig Start Date End Date Taking? Authorizing Provider  Cyanocobalamin (VITAMIN B-12 PO) Take by mouth.   Yes Historical Provider, MD  IRON PO Take by mouth.   Yes Historical Provider, MD  albuterol (PROVENTIL HFA;VENTOLIN HFA) 108 (90 Base) MCG/ACT inhaler Inhale 2 puffs into the lungs every 6 (six) hours as needed for wheezing or shortness of breath.    Historical Provider, MD  montelukast (SINGULAIR) 10 MG tablet Take 1 tablet (10 mg total) by mouth at bedtime. Patient not taking: Reported on 10/05/2015 08/24/15   Nyoka Cowden, MD   Social History   Social History  . Marital status: Married    Spouse name: N/A  . Number of children: 3  . Years of education: N/A   Occupational History  . 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 Smoking status: Never Smoker  . Smokeless tobacco: Never Used  . Alcohol use Yes     Comment: occasional  . Drug use: No  . Sexual activity: Not on file   Other Topics Concern  . Not on file   Social History Narrative   [place]  Divorced, lives w/ g-friend    3 daughters    Review of Systems  Constitutional: Negative for fever.  HENT: Positive for congestion and postnasal drip. Negative for rhinorrhea.   Respiratory: Positive for cough and wheezing.       Objective:   Physical Exam  Constitutional: He is oriented to person, place, and time. He appears well-developed and well-nourished.  HENT:  Head: Normocephalic and atraumatic.  Right Ear: Tympanic membrane, external ear and ear canal normal.  Left Ear: Tympanic membrane, external ear and ear canal normal.  Nose: Mucosal edema present. No rhinorrhea.  Mouth/Throat: Oropharynx is clear and moist and mucous  membranes are normal. No oropharyngeal exudate or posterior oropharyngeal erythema.  Nose: Edema and boggy turbinates bilaterally. Mouth: Small amount of clear discharge in back of throat.   Eyes: Conjunctivae are normal. Pupils are equal, round, and reactive to light.  Neck: Neck supple.  Cardiovascular: Normal rate, regular rhythm, normal heart sounds and intact distal pulses.   No murmur heard. Pulmonary/Chest: Effort normal. No respiratory distress. He has wheezes (inspiratory and expiratory). He has no rhonchi. He has no rales.  Speaking in full sentences.  Abdominal: Soft. There is no tenderness.  Lymphadenopathy:    He has no cervical adenopathy.  Neurological: He is alert and oriented to person, place, and time.  Skin: Skin is warm and dry. No rash noted.  Psychiatric: He has a normal mood and affect. His behavior is normal.  Vitals reviewed.  Vitals:   02/20/17 1713 02/20/17 1746 02/20/17 1807  BP: (!) 156/91 (!) 155/92 130/88  Pulse: 73    Resp: 14    Temp: 98.2 F (36.8 C)    TempSrc: Oral    SpO2: 100%    Weight: 163 lb 6.4 oz (74.1 kg)    Height: 5\' 5"  (1.651 m)        Assessment & Plan:    Keith Long is a 56 y.o. male Allergic rhinitis due to pollen, unspecified chronicity, unspecified seasonality - Plan: cetirizine (ZYRTEC) 10 MG tablet, fluticasone (FLONASE) 50 MCG/ACT nasal spray PND (post-nasal drip) Cough variant asthma - Plan: albuterol (PROVENTIL HFA;VENTOLIN HFA) 108 (90 Base) MCG/ACT inhaler, fluticasone (FLOVENT HFA) 44 MCG/ACT inhaler Wheezing  - Accommodation of allergies and asthma, likely flare of both. Not on any medications currently. Start Flovent HFA, albuterol if needed for breakthrough wheezing. RTC precautions if frequent use. Start Zyrtec and Flonase for allergies. Follow-up in 1 week to assess control.   Lung nodule  - Noted on previous chest x-ray with plan for CT chest that was ordered by Dr. Drue Novel, has not had that completed  yet.  Meds ordered this encounter  Medications  . IRON PO    Sig: Take by mouth.  . Cyanocobalamin (VITAMIN B-12 PO)    Sig: Take by mouth.  Marland Kitchen albuterol (PROVENTIL HFA;VENTOLIN HFA) 108 (90 Base) MCG/ACT inhaler    Sig: Inhale 1-2 puffs into the lungs every 4 (four) hours as needed for wheezing or shortness of breath.    Dispense:  1 Inhaler    Refill:  0  . fluticasone (FLOVENT HFA) 44 MCG/ACT inhaler    Sig: Inhale 2 puffs into the lungs 2 (two) times daily.    Dispense:  1 Inhaler    Refill:  3  . cetirizine (ZYRTEC) 10 MG tablet    Sig: Take 1 tablet (10 mg total) by mouth daily.    Dispense:  30 tablet    Refill:  11  .  fluticasone (FLONASE) 50 MCG/ACT nasal spray    Sig: Place 2 sprays into both nostrils daily.    Dispense:  16 g    Refill:  6   Patient Instructions    For allergies, start Flonase nasal spray 1-2 sprays per nostril each day. Additionally take Zyrtec 10 mg once per day. I sent a prescription, but that may be less expensive to buy over-the-counter.  For asthma, albuterol inhaler up to every 4 hours as needed, but start Flovent inhaler 2 puffs twice per day for asthma treatment for now.  If you still require albuterol more than 3 times per day in the next 3 days, return as you may need to be on prednisone temporarily. Follow-up in one week to determine asthma control at that time and to recheck symptoms.  Return to the clinic or go to the nearest emergency room if any of your symptoms worsen or new symptoms occur.   Allergic Rhinitis Allergic rhinitis is when the mucous membranes in the nose respond to allergens. Allergens are particles in the air that cause your body to have an allergic reaction. This causes you to release allergic antibodies. Through a chain of events, these eventually cause you to release histamine into the blood stream. Although meant to protect the body, it is this release of histamine that causes your discomfort, such as frequent  sneezing, congestion, and an itchy, runny nose. What are the causes? Seasonal allergic rhinitis (hay fever) is caused by pollen allergens that may come from grasses, trees, and weeds. Year-round allergic rhinitis (perennial allergic rhinitis) is caused by allergens such as house dust mites, pet dander, and mold spores. What are the signs or symptoms?  Nasal stuffiness (congestion).  Itchy, runny nose with sneezing and tearing of the eyes. How is this diagnosed? Your health care provider can help you determine the allergen or allergens that trigger your symptoms. If you and your health care provider are unable to determine the allergen, skin or blood testing may be used. Your health care provider will diagnose your condition after taking your health history and performing a physical exam. Your health care provider may assess you for other related conditions, such as asthma, pink eye, or an ear infection. How is this treated? Allergic rhinitis does not have a cure, but it can be controlled by:  Medicines that block allergy symptoms. These may include allergy shots, nasal sprays, and oral antihistamines.  Avoiding the allergen. Hay fever may often be treated with antihistamines in pill or nasal spray forms. Antihistamines block the effects of histamine. There are over-the-counter medicines that may help with nasal congestion and swelling around the eyes. Check with your health care provider before taking or giving this medicine. If avoiding the allergen or the medicine prescribed do not work, there are many new medicines your health care provider can prescribe. Stronger medicine may be used if initial measures are ineffective. Desensitizing injections can be used if medicine and avoidance does not work. Desensitization is when a patient is given ongoing shots until the body becomes less sensitive to the allergen. Make sure you follow up with your health care provider if problems continue. Follow these  instructions at home: It is not possible to completely avoid allergens, but you can reduce your symptoms by taking steps to limit your exposure to them. It helps to know exactly what you are allergic to so that you can avoid your specific triggers. Contact a health care provider if:  You have a fever.  You develop a cough that does not stop easily (persistent).  You have shortness of breath.  You start wheezing.  Symptoms interfere with normal daily activities. This information is not intended to replace advice given to you by your health care provider. Make sure you discuss any questions you have with your health care provider. Document Released: 08/01/2001 Document Revised: 07/07/2016 Document Reviewed: 07/14/2013 Elsevier Interactive Patient Education  2017 [employer] Inc.   Asthma, Adult Asthma is a recurring condition in which the airways tighten and narrow. Asthma can make it difficult to breathe. It can cause coughing, wheezing, and shortness of breath. Asthma episodes, also called asthma attacks, range from minor to life-threatening. Asthma cannot be cured, but medicines and lifestyle changes can help control it. What are the causes? Asthma is believed to be caused by inherited (genetic) and environmental factors, but its exact cause is unknown. Asthma may be triggered by allergens, lung infections, or irritants in the air. Asthma triggers are different for each person. Common triggers include:  Animal dander.  Dust mites.  Cockroaches.  Pollen from trees or grass.  Mold.  Smoke.  Air pollutants such as dust, household cleaners, hair sprays, aerosol sprays, paint fumes, strong chemicals, or strong odors.  Cold air, weather changes, and winds (which increase molds and pollens in the air).  Strong emotional expressions such as crying or laughing hard.  Stress.  Certain medicines (such as aspirin) or types of drugs (such as beta-blockers).  Sulfites in foods and  drinks. Foods and drinks that may contain sulfites include dried fruit, potato chips, and sparkling grape juice.  Infections or inflammatory conditions such as the flu, a cold, or an inflammation of the nasal membranes (rhinitis).  Gastroesophageal reflux disease (GERD).  Exercise or strenuous activity. What are the signs or symptoms? Symptoms may occur immediately after asthma is triggered or many hours later. Symptoms include:  Wheezing.  Excessive nighttime or early morning coughing.  Frequent or severe coughing with a common cold.  Chest tightness.  Shortness of breath. How is this diagnosed? The diagnosis of asthma is made by a review of your medical history and a physical exam. Tests may also be performed. These may include:  Lung function studies. These tests show how much air you breathe in and out.  Allergy tests.  Imaging tests such as X-rays. How is this treated? Asthma cannot be cured, but it can usually be controlled. Treatment involves identifying and avoiding your asthma triggers. It also involves medicines. There are 2 classes of medicine used for asthma treatment:  Controller medicines. These prevent asthma symptoms from occurring. They are usually taken every day.  Reliever or rescue medicines. These quickly relieve asthma symptoms. They are used as needed and provide short-term relief. Your health care provider will help you create an asthma action plan. An asthma action plan is a written plan for managing and treating your asthma attacks. It includes a list of your asthma triggers and how they may be avoided. It also includes information on when medicines should be taken and when their dosage should be changed. An action plan may also involve the use of a device called a peak flow meter. A peak flow meter measures how well the lungs are working. It helps you monitor your condition. Follow these instructions at home:  Take medicines only as directed by your  health care provider. Speak with your health care provider if you have questions about how or when to take the medicines.  Use a  peak flow meter as directed by your health care provider. Record and keep track of readings.  Understand and use the action plan to help minimize or stop an asthma attack without needing to seek medical care.  Control your home environment in the following ways to help prevent asthma attacks:  Do not smoke. Avoid being exposed to secondhand smoke.  Change your heating and air conditioning filter regularly.  Limit your use of fireplaces and wood stoves.  Get rid of pests (such as roaches and mice) and their droppings.  Throw away plants if you see mold on them.  Clean your floors and dust regularly. Use unscented cleaning products.  Try to have someone else vacuum for you regularly. Stay out of rooms while they are being vacuumed and for a short while afterward. If you vacuum, use a dust mask from a hardware store, a double-layered or microfilter vacuum cleaner bag, or a vacuum cleaner with a HEPA filter.  Replace carpet with wood, tile, or vinyl flooring. Carpet can trap dander and dust.  Use allergy-proof pillows, mattress covers, and box spring covers.  Wash bed sheets and blankets every week in hot water and dry them in a dryer.  Use blankets that are made of polyester or cotton.  Clean bathrooms and kitchens with bleach. If possible, have someone repaint the walls in these rooms with mold-resistant paint. Keep out of the rooms that are being cleaned and painted.  Wash hands frequently. Contact a health care provider if:  You have wheezing, shortness of breath, or a cough even if taking medicine to prevent attacks.  The colored mucus you cough up (sputum) is thicker than usual.  Your sputum changes from clear or white to yellow, green, gray, or bloody.  You have any problems that may be related to the medicines you are taking (such as a rash,  itching, swelling, or trouble breathing).  You are using a reliever medicine more than 2-3 times per week.  Your peak flow is still at 50-79% of your personal best after following your action plan for 1 hour.  You have a fever. Get help right away if:  You seem to be getting worse and are unresponsive to treatment during an asthma attack.  You are short of breath even at rest.  You get short of breath when doing very little physical activity.  You have difficulty eating, drinking, or talking due to asthma symptoms.  You develop chest pain.  You develop a fast heartbeat.  You have a bluish color to your lips or fingernails.  You are light-headed, dizzy, or faint.  Your peak flow is less than 50% of your personal best. This information is not intended to replace advice given to you by your health care provider. Make sure you discuss any questions you have with your health care provider. Document Released: 11/06/2005 Document Revised: 04/19/2016 Document Reviewed: 06/05/2013 Elsevier Interactive Patient Education  2017 [employer].   IF you received an x-ray today, you will receive an invoice from [facility]. Please contact [facility] at [phone] with questions or concerns regarding your invoice.   IF you received labwork today, you will receive an invoice from [facility]. Please contact [facility] at [phone] with questions or concerns regarding your invoice.   Our billing staff will not be able to assist you with questions regarding bills from these companies.  You will be contacted with the lab results as soon as they are available. The fastest way to get your results  is to activate your My Chart account. Instructions are located on the last page of this paperwork. If you have not heard from [place] regarding the results in 2 weeks, please contact this office.      I personally performed the services described in this documentation, which was scribed in  my presence. The recorded information has been reviewed and considered for accuracy and completeness, addended by me as needed, and agree with information above.  Signed,   Meredith Staggers, MD Primary [facility] at [facility].  02/21/17 11:11 PM

## 2017-02-20 NOTE — Patient Instructions (Addendum)
For allergies, start Flonase nasal spray 1-2 sprays per nostril each day. Additionally take Zyrtec 10 mg once per day. I sent a prescription, but that may be less expensive to buy over-the-counter.  For asthma, albuterol inhaler up to every 4 hours as needed, but start Flovent inhaler 2 puffs twice per day for asthma treatment for now.  If you still require albuterol more than 3 times per day in the next 3 days, return as you may need to be on prednisone temporarily. Follow-up in one week to determine asthma control at that time and to recheck symptoms.  Return to the clinic or go to the nearest emergency room if any of your symptoms worsen or new symptoms occur.   Allergic Rhinitis Allergic rhinitis is when the mucous membranes in the nose respond to allergens. Allergens are particles in the air that cause your body to have an allergic reaction. This causes you to release allergic antibodies. Through a chain of events, these eventually cause you to release histamine into the blood stream. Although meant to protect the body, it is this release of histamine that causes your discomfort, such as frequent sneezing, congestion, and an itchy, runny nose. What are the causes? Seasonal allergic rhinitis (hay fever) is caused by pollen allergens that may come from grasses, trees, and weeds. Year-round allergic rhinitis (perennial allergic rhinitis) is caused by allergens such as house dust mites, pet dander, and mold spores. What are the signs or symptoms?  Nasal stuffiness (congestion).  Itchy, runny nose with sneezing and tearing of the eyes. How is this diagnosed? Your health care provider can help you determine the allergen or allergens that trigger your symptoms. If you and your health care provider are unable to determine the allergen, skin or blood testing may be used. Your health care provider will diagnose your condition after taking your health history and performing a physical exam. Your health  care provider may assess you for other related conditions, such as asthma, pink eye, or an ear infection. How is this treated? Allergic rhinitis does not have a cure, but it can be controlled by:  Medicines that block allergy symptoms. These may include allergy shots, nasal sprays, and oral antihistamines.  Avoiding the allergen. Hay fever may often be treated with antihistamines in pill or nasal spray forms. Antihistamines block the effects of histamine. There are over-the-counter medicines that may help with nasal congestion and swelling around the eyes. Check with your health care provider before taking or giving this medicine. If avoiding the allergen or the medicine prescribed do not work, there are many new medicines your health care provider can prescribe. Stronger medicine may be used if initial measures are ineffective. Desensitizing injections can be used if medicine and avoidance does not work. Desensitization is when a patient is given ongoing shots until the body becomes less sensitive to the allergen. Make sure you follow up with your health care provider if problems continue. Follow these instructions at home: It is not possible to completely avoid allergens, but you can reduce your symptoms by taking steps to limit your exposure to them. It helps to know exactly what you are allergic to so that you can avoid your specific triggers. Contact a health care provider if:  You have a fever.  You develop a cough that does not stop easily (persistent).  You have shortness of breath.  You start wheezing.  Symptoms interfere with normal daily activities. This information is not intended to replace advice given to  you by your health care provider. Make sure you discuss any questions you have with your health care provider. Document Released: 08/01/2001 Document Revised: 07/07/2016 Document Reviewed: 07/14/2013 Elsevier Interactive Patient Education  2017 [employer] Inc[employer]   Asthma,  Adult Asthma is a recurring condition in which the airways tighten and narrow. Asthma can make it difficult to breathe. It can cause coughing, wheezing, and shortness of breath. Asthma episodes, also called asthma attacks, range from minor to life-threatening. Asthma cannot be cured, but medicines and lifestyle changes can help control it. What are the causes? Asthma is believed to be caused by inherited (genetic) and environmental factors, but its exact cause is unknown. Asthma may be triggered by allergens, lung infections, or irritants in the air. Asthma triggers are different for each person. Common triggers include:  Animal dander.  Dust mites.  Cockroaches.  Pollen from trees or grass.  Mold.  Smoke.  Air pollutants such as dust, household cleaners, hair sprays, aerosol sprays, paint fumes, strong chemicals, or strong odors.  Cold air, weather changes, and winds (which increase molds and pollens in the air).  Strong emotional expressions such as crying or laughing hard.  Stress.  Certain medicines (such as aspirin) or types of drugs (such as beta-blockers).  Sulfites in foods and drinks. Foods and drinks that may contain sulfites include dried fruit, potato chips, and sparkling grape juice.  Infections or inflammatory conditions such as the flu, a cold, or an inflammation of the nasal membranes (rhinitis).  Gastroesophageal reflux disease (GERD).  Exercise or strenuous activity. What are the signs or symptoms? Symptoms may occur immediately after asthma is triggered or many hours later. Symptoms include:  Wheezing.  Excessive nighttime or early morning coughing.  Frequent or severe coughing with a common cold.  Chest tightness.  Shortness of breath. How is this diagnosed? The diagnosis of asthma is made by a review of your medical history and a physical exam. Tests may also be performed. These may include:  Lung function studies. These tests show how much air  you breathe in and out.  Allergy tests.  Imaging tests such as X-rays. How is this treated? Asthma cannot be cured, but it can usually be controlled. Treatment involves identifying and avoiding your asthma triggers. It also involves medicines. There are 2 classes of medicine used for asthma treatment:  Controller medicines. These prevent asthma symptoms from occurring. They are usually taken every day.  Reliever or rescue medicines. These quickly relieve asthma symptoms. They are used as needed and provide short-term relief. Your health care provider will help you create an asthma action plan. An asthma action plan is a written plan for managing and treating your asthma attacks. It includes a list of your asthma triggers and how they may be avoided. It also includes information on when medicines should be taken and when their dosage should be changed. An action plan may also involve the use of a device called a peak flow meter. A peak flow meter measures how well the lungs are working. It helps you monitor your condition. Follow these instructions at home:  Take medicines only as directed by your health care provider. Speak with your health care provider if you have questions about how or when to take the medicines.  Use a peak flow meter as directed by your health care provider. Record and keep track of readings.  Understand and use the action plan to help minimize or stop an asthma attack without needing to seek medical care.  Control your home environment in the following ways to help prevent asthma attacks:  Do not smoke. Avoid being exposed to secondhand smoke.  Change your heating and air conditioning filter regularly.  Limit your use of fireplaces and wood stoves.  Get rid of pests (such as roaches and mice) and their droppings.  Throw away plants if you see mold on them.  Clean your floors and dust regularly. Use unscented cleaning products.  Try to have someone else vacuum  for you regularly. Stay out of rooms while they are being vacuumed and for a short while afterward. If you vacuum, use a dust mask from a hardware store, a double-layered or microfilter vacuum cleaner bag, or a vacuum cleaner with a HEPA filter.  Replace carpet with wood, tile, or vinyl flooring. Carpet can trap dander and dust.  Use allergy-proof pillows, mattress covers, and box spring covers.  Wash bed sheets and blankets every week in hot water and dry them in a dryer.  Use blankets that are made of polyester or cotton.  Clean bathrooms and kitchens with bleach. If possible, have someone repaint the walls in these rooms with mold-resistant paint. Keep out of the rooms that are being cleaned and painted.  Wash hands frequently. Contact a health care provider if:  You have wheezing, shortness of breath, or a cough even if taking medicine to prevent attacks.  The colored mucus you cough up (sputum) is thicker than usual.  Your sputum changes from clear or white to yellow, green, gray, or bloody.  You have any problems that may be related to the medicines you are taking (such as a rash, itching, swelling, or trouble breathing).  You are using a reliever medicine more than 2-3 times per week.  Your peak flow is still at 50-79% of your personal best after following your action plan for 1 hour.  You have a fever. Get help right away if:  You seem to be getting worse and are unresponsive to treatment during an asthma attack.  You are short of breath even at rest.  You get short of breath when doing very little physical activity.  You have difficulty eating, drinking, or talking due to asthma symptoms.  You develop chest pain.  You develop a fast heartbeat.  You have a bluish color to your lips or fingernails.  You are light-headed, dizzy, or faint.  Your peak flow is less than 50% of your personal best. This information is not intended to replace advice given to you by your  health care provider. Make sure you discuss any questions you have with your health care provider. Document Released: 11/06/2005 Document Revised: 04/19/2016 Document Reviewed: 06/05/2013 Elsevier Interactive Patient Education  2017 [employer].   IF you received an x-ray today, you will receive an invoice from [facility]. Please contact [facility] at [phone] with questions or concerns regarding your invoice.   IF you received labwork today, you will receive an invoice from [facility]. Please contact [facility] at [phone] with questions or concerns regarding your invoice.   Our billing staff will not be able to assist you with questions regarding bills from these companies.  You will be contacted with the lab results as soon as they are available. The fastest way to get your results is to activate your My Chart account. Instructions are located on the last page of this paperwork. If you have not heard from [place] regarding the results in 2 weeks, please contact this office.

## 2017-02-27 ENCOUNTER — Ambulatory Visit: Payer: BLUE CROSS/BLUE SHIELD

## 2017-03-02 ENCOUNTER — Ambulatory Visit (INDEPENDENT_AMBULATORY_CARE_PROVIDER_SITE_OTHER): Payer: BLUE CROSS/BLUE SHIELD | Admitting: Internal Medicine

## 2017-03-02 ENCOUNTER — Encounter: Payer: Self-pay | Admitting: Internal Medicine

## 2017-03-02 VITALS — BP 128/78 | HR 88 | Temp 98.0°F | Resp 14 | Ht 65.0 in | Wt 162.1 lb

## 2017-03-02 DIAGNOSIS — R911 Solitary pulmonary nodule: Secondary | ICD-10-CM

## 2017-03-02 DIAGNOSIS — J45901 Unspecified asthma with (acute) exacerbation: Secondary | ICD-10-CM

## 2017-03-02 MED ORDER — AZITHROMYCIN 250 MG PO TABS: ORAL_TABLET | ORAL | 0 refills | Status: DC

## 2017-03-02 MED ORDER — MOMETASONE FURO-FORMOTEROL FUM 100-5 MCG/ACT IN AERO: 2.0000 | INHALATION_SPRAY | Freq: Two times a day (BID) | RESPIRATORY_TRACT | 3 refills | Status: DC

## 2017-03-02 MED ORDER — PREDNISONE 10 MG PO TABS: ORAL_TABLET | ORAL | 0 refills | Status: DC

## 2017-03-02 MED ORDER — ALBUTEROL SULFATE HFA 108 (90 BASE) MCG/ACT IN AERS: 2.0000 | INHALATION_SPRAY | Freq: Four times a day (QID) | RESPIRATORY_TRACT | 1 refills | Status: DC | PRN

## 2017-03-02 NOTE — Progress Notes (Signed)
Subjective:    Patient ID: Keith Long, male    DOB: Apr 15, 1961, 56 y.o.   MRN: [id number]  DOS:  03/02/2017 Type of visit - description : Acute visit Interval history: History of asthma, was doing relatively well until 4 weeks ago when he developed cough, postnasal dripping, chest congestion and wheezing. Went to urgent care, was recommended Zyrtec, Nasonex, Flovent and albuterol. His insurance will not cover prescribed inhalers according to the patient. Good compliance with the other 2 medicines. Symptoms are about the same.  Also, had an abnormal chest x-ray, has not proceeded with a follow-up CT due to lack of insurance.  Review of Systems  + Subjective fever with the onset of symptoms, not anymore. No chest pain. + Shortness of breath on and off. No nasal discharge but admits to sputum production, yellow in color. Few days ago saw small specks of blood mixed with the sputum. No further episodes.    Past Medical History:  Diagnosis Date  . Allergy   . Anemia   . Arthritis   . Asthma    Dr. Sherene Sires  . Hemorrhoids   . Rectal bleeding   . Right inguinal hernia 2016   Dr. Sheliah Hatch    Past Surgical History:  Procedure Laterality Date  . HEMORRHOID SURGERY     local injection  . NOSE SURGERY  1992    Social History   Social History  . Marital status: Married    Spouse name: N/A  . Number of children: 3  . Years of education: N/A   Occupational History  . 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 Smoking status: Never Smoker  . Smokeless tobacco: Never Used  . Alcohol use Yes     Comment: occasional  . Drug use: No  . Sexual activity: Not on file   Other Topics Concern  . Not on file   Social History Narrative   [place]   Divorced, lives w/ g-friend    3 daughters       Allergies as of 03/02/2017      Reactions   Augmentin [amoxicillin-pot Clavulanate] Nausea Only      Medication List       Accurate as of  03/02/17 11:59 PM. Always use your most recent med list.          albuterol 108 (90 Base) MCG/ACT inhaler Commonly known as:  VENTOLIN HFA Inhale 2 puffs into the lungs every 6 (six) hours as needed for wheezing or shortness of breath.   azithromycin 250 MG tablet Commonly known as:  ZITHROMAX Z-PAK 2 tabs a day the first day, then 1 tab a day x 4 days   cetirizine 10 MG tablet Commonly known as:  ZYRTEC Take 1 tablet (10 mg total) by mouth daily.   fluticasone 50 MCG/ACT nasal spray Commonly known as:  FLONASE Place 2 sprays into both nostrils daily.   IRON PO Take by mouth.   mometasone-formoterol 100-5 MCG/ACT Aero Commonly known as:  DULERA Inhale 2 puffs into the lungs 2 (two) times daily.   predniSONE 10 MG tablet Commonly known as:  DELTASONE 4 tablets x 2 days, 3 tabs x 2 days, 2 tabs x 2 days, 1 tab x 2 days   VITAMIN B-12 PO Take by mouth.          Objective:   Physical Exam BP 128/78 (BP Location: Left Arm, Patient Position: Sitting, Cuff Size: Small)  Pulse 88   Temp 98 F (36.7 C) (Oral)   Resp 14   Ht  (1.651 m)   Wt 162 lb 2 oz (73.5 kg)   SpO2 97%   BMI 26.98 kg/m  General:   Well developed, well nourished . NAD.  HEENT:  Normocephalic . Face symmetric, atraumatic. TMs normal, nose congested, sinuses no TTP. Lungs:  Rhonchi, + wheezing, mild to moderate bilaterally, no respiratory distress. Normal respiratory effort, no intercostal retractions, no accessory muscle use. Heart: RRR,  no murmur.  No pretibial edema bilaterally  Skin: Not pale. Not jaundice Neurologic:  alert & oriented X3.  Speech normal, gait appropriate for age and unassisted Psych--  Cognition and judgment appear intact.  Cooperative with normal attention span and concentration.  Behavior appropriate. No anxious or depressed appearing.      Assessment & Plan:   Assessment > Hyperlipidemia Cough variant Asthma -- Dr Sherene Sires Nl PFTs 10-2015  - allergy  profile 05/11/2015 > Eos 0.5, IgE 208 Pos RAST Grass/trees/ ragweed - Restarted singulair 08/24/2015  - Sinus CT 09/02/2015 1. No definite sinusitis. H/o  flonase intolerance Allergic rhinitis Hemorrhoids status post local injections and a colonoscopy 2014. Still has occasional bleeding  PLAN Asthma exacerbation: Sxs resurfaced  4 weeks ago, went to urgent care, taking Zyrtec and Flonase however has not been able to get his inhalers due to insurance constraints. Plan: Prednisone by mouth, Dulera , albuterol. Zithromax. Continue Zyrtec and nasal steroids. Inhalation techniques reviewed with the patient. Concept of recue Inhaler also reviewed. Hemoptysis: As described above, related to acute asthma exacerbation?. Definite call if this happens again. See next. Abnormal chest x-ray 02/04/2016: Left chest nodule. Explained  patient this could be serious (Ca) and recommend CT chest. Agree to proceed, will schedule CT 2 weeks from today  (once he is better). RTC 1 month

## 2017-03-02 NOTE — Patient Instructions (Signed)
Next visit in one month. Please make an appointment   Take prednisone as prescribed  Dulera twice a day regardless of the  symptoms  Albuterol: Every 6 hours as needed for cough, wheezing or chest congestion  Take  antibiotic called  Z-Pak  OTC MUCINEX  DM twice a day until better  Continue Flonase and Zyrtec

## 2017-03-02 NOTE — Progress Notes (Signed)
Pre visit review using our clinic review tool, if applicable. No additional management support is needed unless otherwise documented below in the visit note. 

## 2017-03-05 NOTE — Assessment & Plan Note (Signed)
Asthma exacerbation: Sxs resurfaced  4 weeks ago, went to urgent care, taking Zyrtec and Flonase however has not been able to get his inhalers due to insurance constraints. Plan: Prednisone by mouth, Dulera , albuterol. Zithromax. Continue Zyrtec and nasal steroids. Inhalation techniques reviewed with the patient. Concept of recue Inhaler also reviewed. Hemoptysis: As described above, related to acute asthma exacerbation?. Definite call if this happens again. See next. Abnormal chest x-ray 02/04/2016: Left chest nodule. Explained  patient this could be serious (Ca) and recommend CT chest. Agree to proceed, will schedule CT 2 weeks from today  (once he is better). RTC 1 month

## 2017-03-17 ENCOUNTER — Ambulatory Visit (HOSPITAL_BASED_OUTPATIENT_CLINIC_OR_DEPARTMENT_OTHER): Payer: BLUE CROSS/BLUE SHIELD

## 2017-04-03 ENCOUNTER — Ambulatory Visit: Payer: BLUE CROSS/BLUE SHIELD | Admitting: Internal Medicine

## 2017-04-04 ENCOUNTER — Ambulatory Visit (INDEPENDENT_AMBULATORY_CARE_PROVIDER_SITE_OTHER): Payer: BLUE CROSS/BLUE SHIELD | Admitting: Internal Medicine

## 2017-04-04 ENCOUNTER — Encounter: Payer: Self-pay | Admitting: Internal Medicine

## 2017-04-04 VITALS — BP 130/80 | HR 74 | Temp 98.3°F | Resp 16 | Ht 65.0 in | Wt 164.2 lb

## 2017-04-04 DIAGNOSIS — J45991 Cough variant asthma: Secondary | ICD-10-CM

## 2017-04-04 DIAGNOSIS — J309 Allergic rhinitis, unspecified: Secondary | ICD-10-CM | POA: Diagnosis not present

## 2017-04-04 DIAGNOSIS — L989 Disorder of the skin and subcutaneous tissue, unspecified: Secondary | ICD-10-CM | POA: Diagnosis not present

## 2017-04-04 MED ORDER — AZELASTINE HCL 0.1 % NA SOLN: 2.0000 | Freq: Every evening | NASAL | 6 refills | Status: DC | PRN

## 2017-04-04 MED ORDER — MOMETASONE FURO-FORMOTEROL FUM 100-5 MCG/ACT IN AERO: 2.0000 | INHALATION_SPRAY | Freq: Two times a day (BID) | RESPIRATORY_TRACT | 5 refills | Status: DC

## 2017-04-04 MED ORDER — MONTELUKAST SODIUM 10 MG PO TABS: 10.0000 mg | ORAL_TABLET | Freq: Every day | ORAL | 6 refills | Status: DC

## 2017-04-04 NOTE — Progress Notes (Signed)
Subjective:    Patient ID: Keith Long, male    DOB: 1961-03-21, 56 y.o.   MRN: [id number]  DOS:  04/04/2017 Type of visit - description : f/u Interval history: Since the last visit he is taking medications as prescribed. Cough, wheezing much decreased. Continue with "sinusitis": + Nasal discharge, postnasal dripping, mucus pooling in the throat. Was unable to pursue a CT due to cost   Review of Systems Denies fever chills + Sneezing, itchy eyes and nose. Does not feel Zyrtec is helping. No further hemoptysis  Past Medical History:  Diagnosis Date  . Allergy   . Anemia   . Arthritis   . Asthma    Dr. Sherene SiresWert  . Hemorrhoids   . Rectal bleeding   . Right inguinal hernia 2016   Dr. Sheliah HatchKinsinger    Past Surgical History:  Procedure Laterality Date  . HEMORRHOID SURGERY     local injection  . NOSE SURGERY  1992    Social History   Social History  . Marital status: Married    Spouse name: N/A  . Number of children: 3  . Years of education: N/A   Occupational History  . 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 Smoking status: Never Smoker  . Smokeless tobacco: Never Used  . Alcohol use Yes     Comment: occasional  . Drug use: No  . Sexual activity: Not on file   Other Topics Concern  . Not on file   Social History Narrative   Original from [place]   Divorced, lives w/ g-friend    3 daughters       Allergies as of 04/04/2017      Reactions   Augmentin [amoxicillin-pot Clavulanate] Nausea Only      Medication List       Accurate as of 04/04/17 11:59 PM. Always use your most recent med list.          albuterol 108 (90 Base) MCG/ACT inhaler Commonly known as:  VENTOLIN HFA Inhale 2 puffs into the lungs every 6 (six) hours as needed for wheezing or shortness of breath.   azelastine 0.1 % nasal spray Commonly known as:  ASTELIN Place 2 sprays into both nostrils at bedtime as needed for rhinitis. Use in each nostril as  directed   fluticasone 50 MCG/ACT nasal spray Commonly known as:  FLONASE Place 2 sprays into both nostrils daily.   IRON PO Take by mouth.   mometasone-formoterol 100-5 MCG/ACT Aero Commonly known as:  DULERA Inhale 2 puffs into the lungs 2 (two) times daily.   montelukast 10 MG tablet Commonly known as:  SINGULAIR Take 1 tablet (10 mg total) by mouth at bedtime.   VITAMIN B-12 PO Take by mouth.          Objective:   Physical Exam BP 130/80 (BP Location: Right Arm, Cuff Size: Normal)   Pulse 74   Temp 98.3 F (36.8 C) (Oral)   Resp 16   Ht 5\' 5"  (1.651 m)   Wt 164 lb 3.2 oz (74.5 kg)   SpO2 97%   BMI 27.32 kg/m  General:   Well developed, well nourished . NAD.  HEENT:  Normocephalic . Face symmetric, atraumatic. TMs normal, nose is slightly congested, sinuses no TTP Lungs:  Few rhonchi, no wheezing. Normal respiratory effort, no intercostal retractions, no accessory muscle use. Heart: RRR,  no murmur.  No pretibial edema bilaterally  Skin:  Right temporal has irregular  shape, papular, slightly hyperpigmented, 32 cm skin lesion.  Also on the crown has somewhat flat dark mole. Neurologic:  alert & oriented X3.  Speech normal, gait appropriate for age and unassisted Psych--  Cognition and judgment appear intact.  Cooperative with normal attention span and concentration.  Behavior appropriate. No anxious or depressed appearing.      Assessment & Plan:  Assessment > Hyperlipidemia Cough variant Asthma -- Dr Sherene Sires Nl PFTs 10-2015  - allergy profile 05/11/2015 > Eos 0.5, IgE 208 Pos RAST Grass/trees/ ragweed - Restarted singulair 08/24/2015  - Sinus CT 09/02/2015 1. No definite sinusitis. H/o  flonase intolerance Allergic rhinitis Hemorrhoids status post local injections and a colonoscopy 2014. Still has occasional bleeding Skin lesion R temple, started age 3, no change as off 03-2017  PLAN Asthma: Improved, continue with Dulera and albuterol  prn Allergies: "Sinusitis" sx as above consistent with allergic sinusitis. Patient feels Zyrtec is not helping. Recommend to continue Flonase, add Astelin-Singulair. Switch to Claritin. Hemoptysis: Resolved Abnormal chest x-ray: Unable to afford CT chest even with payment program. Will try another system (message sent to scheduler). Skin lesion: located at R temple, unchanged per pt but aspect somewhat worrisome. Will refer to dermatology Rec CPX at his convenience

## 2017-04-04 NOTE — Patient Instructions (Signed)
Schedule a physical exam at your convenience  For allergies:  Flonase 2 sprays in each side of the nose every morning  Astelin 2 sprays on each side of the nose every night  Stop Zyrtec, start Claritin 10 mg daily  Start Singulair 1 tablet daily.

## 2017-04-05 NOTE — Assessment & Plan Note (Signed)
Asthma: Improved, continue with Dulera and albuterol prn Allergies: "Sinusitis" sx as above consistent with allergic sinusitis. Patient feels Zyrtec is not helping. Recommend to continue Flonase, add Astelin-Singulair. Switch to Claritin. Hemoptysis: Resolved Abnormal chest x-ray: Unable to afford CT chest even with payment program. Will try another system (message sent to scheduler). Skin lesion: located at R temple, unchanged per pt but aspect somewhat worrisome. Will refer to dermatology Rec CPX at his convenience

## 2017-04-20 ENCOUNTER — Encounter: Payer: Self-pay | Admitting: Internal Medicine

## 2017-04-20 ENCOUNTER — Ambulatory Visit (INDEPENDENT_AMBULATORY_CARE_PROVIDER_SITE_OTHER): Payer: BLUE CROSS/BLUE SHIELD | Admitting: Internal Medicine

## 2017-04-20 ENCOUNTER — Ambulatory Visit (HOSPITAL_BASED_OUTPATIENT_CLINIC_OR_DEPARTMENT_OTHER)
Admission: RE | Admit: 2017-04-20 | Discharge: 2017-04-20 | Disposition: A | Discharge status: Home / Self Care | Payer: BLUE CROSS/BLUE SHIELD | Source: Ambulatory Visit | Attending: Internal Medicine | Admitting: Internal Medicine

## 2017-04-20 VITALS — BP 118/78 | HR 78 | Temp 97.6°F | Resp 14 | Ht 65.0 in | Wt 163.5 lb

## 2017-04-20 DIAGNOSIS — M159 Polyosteoarthritis, unspecified: Secondary | ICD-10-CM | POA: Insufficient documentation

## 2017-04-20 DIAGNOSIS — Z0000 Encounter for general adult medical examination without abnormal findings: Secondary | ICD-10-CM | POA: Diagnosis not present

## 2017-04-20 DIAGNOSIS — M795 Residual foreign body in soft tissue: Secondary | ICD-10-CM | POA: Diagnosis not present

## 2017-04-20 DIAGNOSIS — Z1159 Encounter for screening for other viral diseases: Secondary | ICD-10-CM

## 2017-04-20 DIAGNOSIS — Z114 Encounter for screening for human immunodeficiency virus [HIV]: Secondary | ICD-10-CM

## 2017-04-20 NOTE — Patient Instructions (Signed)
  GO TO THE FRONT DESK Schedule labs to be done next week, fasting Schedule your next appointment for a checkup in 6 months     STOP BY THE FIRST FLOOR:  get the XR

## 2017-04-20 NOTE — Progress Notes (Signed)
Subjective:    Patient ID: Keith Long, male    DOB: 1960/11/26, 56 y.o.   MRN: [id number]  DOS:  04/20/2017 Type of visit - description : cpx Interval history: We also discussed a number of other issues  Review of Systems  asthma, still has mild cough . Allergies not well controlled, continue with postnasal dripping mucus pooling in the throat. Intolerant to Astelin due to "bad test". Feels that Singulair did not help. Also reports generalized arthralgias, worse on the fingers and left knee. Does not recall any redness or swelling at the wrist or hands. Symptoms going on for about few years. He also half of frontal headache, not the worst of his life, no associated with nausea or vomiting. Denies generalized headache. Still  concerned about a hernia   Other than above, a 14 point review of systems is negative     Past Medical History:  Diagnosis Date  . Allergy   . Anemia   . Arthritis   . Asthma    Dr. Sherene Sires  . Hemorrhoids   . Rectal bleeding   . Right inguinal hernia 2016   Dr. Sheliah Hatch    Past Surgical History:  Procedure Laterality Date  . HEMORRHOID SURGERY     local injection  . NOSE SURGERY  1992    Social History   Social History  . Marital status: Married    Spouse name: N/A  . Number of children: 3  . Years of education: N/A   Occupational History  . 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 Smoking status: Never Smoker  . Smokeless tobacco: Never Used  . Alcohol use Yes     Comment: occasional  . Drug use: No  . Sexual activity: Not on file   Other Topics Concern  . Not on file   Social History Narrative   [place]   Divorced, lives w/ g-friend    3 daughters      Family History  Problem Relation Age of Onset  . Diabetes Mother   . Stroke Father   . Colon cancer Neg Hx   . Prostate cancer Neg Hx      Allergies as of 04/20/2017      Reactions   Augmentin [amoxicillin-pot Clavulanate] Nausea  Only      Medication List       Accurate as of 04/20/17  4:04 PM. Always use your most recent med list.          albuterol 108 (90 Base) MCG/ACT inhaler Commonly known as:  VENTOLIN HFA Inhale 2 puffs into the lungs every 6 (six) hours as needed for wheezing or shortness of breath.   azelastine 0.1 % nasal spray Commonly known as:  ASTELIN Place 2 sprays into both nostrils at bedtime as needed for rhinitis. Use in each nostril as directed   fluticasone 50 MCG/ACT nasal spray Commonly known as:  FLONASE Place 2 sprays into both nostrils daily.   IRON PO Take by mouth.   mometasone-formoterol 100-5 MCG/ACT Aero Commonly known as:  DULERA Inhale 2 puffs into the lungs 2 (two) times daily.   montelukast 10 MG tablet Commonly known as:  SINGULAIR Take 1 tablet (10 mg total) by mouth at bedtime.   VITAMIN B-12 PO Take by mouth.          Objective:   Physical Exam BP 118/78 (BP Location: Left Arm, Patient Position: Sitting, Cuff Size: Normal)   Pulse  78   Temp 97.6 F (36.4 C) (Oral)   Resp 14   Ht 5\' 5"  (1.651 m)   Wt 163 lb 8 oz (74.2 kg)   SpO2 94%   BMI 27.21 kg/m   General:   Well developed, well nourished . NAD.  Neck: No  thyromegaly  HEENT:  Normocephalic . Face symmetric, atraumatic. Nose is slightly congested Lungs:  Few rhonchi with cough Normal respiratory effort, no intercostal retractions, no accessory muscle use. Heart: RRR,  no murmur.  No pretibial edema bilaterally  Abdomen:  Not distended, soft, non-tender. No rebound or rigidity.  + Reducible small hernia, right side. DRE: External appearance normal, prostate normal, no stools found. GU: Scrotal contents normal MSK: Wrist with minimal if any puffiness, hands with bony changes consistent with DJD particularly at the PIPs, no puffiness there. Skin: Exposed areas without rash. Not pale. Not jaundice Neurologic:  alert & oriented X3.  Speech normal, gait appropriate for age and  unassisted Strength symmetric and appropriate for age.  Psych: Cognition and judgment appear intact.  Cooperative with normal attention span and concentration.  Behavior appropriate. No anxious or depressed appearing.    Assessment & Plan:   Assessment > Hyperlipidemia Cough variant Asthma -- Dr Sherene SiresWert Nl PFTs 10-2015  - allergy profile 05/11/2015 > Eos 0.5, IgE 208 Pos RAST Grass/trees/ ragweed - Restarted singulair 08/24/2015  - Sinus CT 09/02/2015 1. No definite sinusitis. H/o  flonase intolerance Allergic rhinitis Hemorrhoids status post local injections and a colonoscopy 2014. Still has occasional bleeding Skin lesion R temple, started age 56, no change as off 03-2017  PLAN Asthma: Improved, still has occasional cough Allergies: Intolerant to Astelin, feels that Singulair is not helping. Recommend to see an allergist: DECLINED referral . Hernia, right side: Still concerned about it, exam is confirmatory, he already discuss the issue with surgery and was offer a repair but has not proceeded. Sxs of incarceration discuss, advised to call me whenever he is ready to be refer. DJD: Generalized pain in the joints, suspect DJD but given the symmetry and intensity will rule out other etiologies: Sedimentation rate, rheumatoid factor,ANA, vit D. X-ray of hands. Refer to ortho or rheumatology if sx continue and depending results.   RTC 6 months

## 2017-04-20 NOTE — Progress Notes (Signed)
Pre visit review using our clinic review tool, if applicable. No additional management support is needed unless otherwise documented below in the visit note. 

## 2017-04-20 NOTE — Assessment & Plan Note (Signed)
--  Td 2013; pnm 23:2016 --CCS: never Cscope, options discussed , cost is an issue, pt will call insurance to see if cscope is covered and let me know --DRE today wnl, check a PSA --Patient education: Diet and exercise --Labs: CMP, FLP, CBC, A1c, TSH, PSA, HIV, hep C, vitamin D, sedimentation rate, ANA, RF. Will come back fasting

## 2017-04-21 NOTE — Assessment & Plan Note (Signed)
Asthma: Improved, still has occasional cough Allergies: Intolerant to Astelin, feels that Singulair is not helping. Recommend to see an allergist: DECLINED referral . Hernia, right side: Still concerned about it, exam is confirmatory, he already discuss the issue with surgery and was offer a repair but has not proceeded. Sxs of incarceration discuss, advised to call me whenever he is ready to be refer. DJD: Generalized pain in the joints, suspect DJD but given the symmetry and intensity will rule out other etiologies: Sedimentation rate, rheumatoid factor,ANA, vit D. X-ray of hands. Refer to ortho or rheumatology if sx continue and depending results.   RTC 6 months

## 2017-04-24 ENCOUNTER — Other Ambulatory Visit (INDEPENDENT_AMBULATORY_CARE_PROVIDER_SITE_OTHER): Payer: BLUE CROSS/BLUE SHIELD

## 2017-04-24 DIAGNOSIS — Z0000 Encounter for general adult medical examination without abnormal findings: Secondary | ICD-10-CM

## 2017-04-24 DIAGNOSIS — Z114 Encounter for screening for human immunodeficiency virus [HIV]: Secondary | ICD-10-CM

## 2017-04-24 DIAGNOSIS — Z1159 Encounter for screening for other viral diseases: Secondary | ICD-10-CM

## 2017-04-24 DIAGNOSIS — M159 Polyosteoarthritis, unspecified: Secondary | ICD-10-CM | POA: Diagnosis not present

## 2017-04-24 LAB — CBC WITH DIFFERENTIAL/PLATELET
BASOS ABS: 0.1 10*3/uL (ref 0.0–0.1)
Basophils Relative: 1.2 % (ref 0.0–3.0)
Eosinophils Absolute: 0.5 10*3/uL (ref 0.0–0.7)
Eosinophils Relative: 8.1 % — ABNORMAL HIGH (ref 0.0–5.0)
HEMATOCRIT: 39 % (ref 39.0–52.0)
Hemoglobin: 12.7 g/dL — ABNORMAL LOW (ref 13.0–17.0)
LYMPHS PCT: 41.5 % (ref 12.0–46.0)
Lymphs Abs: 2.3 10*3/uL (ref 0.7–4.0)
MCHC: 32.5 g/dL (ref 30.0–36.0)
MCV: 81 fl (ref 78.0–100.0)
MONOS PCT: 8.1 % (ref 3.0–12.0)
Monocytes Absolute: 0.4 10*3/uL (ref 0.1–1.0)
NEUTROS ABS: 2.3 10*3/uL (ref 1.4–7.7)
Neutrophils Relative %: 41.1 % — ABNORMAL LOW (ref 43.0–77.0)
PLATELETS: 277 10*3/uL (ref 150.0–400.0)
RBC: 4.82 Mil/uL (ref 4.22–5.81)
RDW: 17.3 % — ABNORMAL HIGH (ref 11.5–15.5)
WBC: 5.6 10*3/uL (ref 4.0–10.5)

## 2017-04-24 LAB — COMPREHENSIVE METABOLIC PANEL
ALBUMIN: 4.3 g/dL (ref 3.5–5.2)
ALT: 22 U/L (ref 0–53)
AST: 20 U/L (ref 0–37)
Alkaline Phosphatase: 81 U/L (ref 39–117)
BILIRUBIN TOTAL: 0.4 mg/dL (ref 0.2–1.2)
BUN: 11 mg/dL (ref 6–23)
CO2: 27 meq/L (ref 19–32)
CREATININE: 0.83 mg/dL (ref 0.40–1.50)
Calcium: 9.7 mg/dL (ref 8.4–10.5)
Chloride: 106 mEq/L (ref 96–112)
GFR: 102.83 mL/min (ref >60.00)
Glucose, Bld: 100 mg/dL — ABNORMAL HIGH (ref 70–99)
Potassium: 4 mEq/L (ref 3.5–5.1)
Sodium: 139 mEq/L (ref 135–145)
Total Protein: 7.4 g/dL (ref 6.0–8.3)

## 2017-04-24 LAB — HIV ANTIBODY (ROUTINE TESTING W REFLEX): HIV: NONREACTIVE

## 2017-04-24 LAB — LIPID PANEL
CHOL/HDL RATIO: 4
CHOLESTEROL: 176 mg/dL (ref 0–200)
HDL: 40.8 mg/dL (ref >39.00)
LDL Cholesterol: 99 mg/dL (ref 0–99)
NonHDL: 135.69
Triglycerides: 181 mg/dL — ABNORMAL HIGH (ref 0.0–149.0)
VLDL: 36.2 mg/dL (ref 0.0–40.0)

## 2017-04-24 LAB — SEDIMENTATION RATE: Sed Rate: 16 mm/hr (ref 0–20)

## 2017-04-24 LAB — TSH: TSH: 1.44 u[IU]/mL (ref 0.35–4.50)

## 2017-04-24 LAB — PSA: PSA: 0.93 ng/mL (ref 0.10–4.00)

## 2017-04-24 LAB — HEMOGLOBIN A1C: Hgb A1c MFr Bld: 6.4 % (ref 4.6–6.5)

## 2017-04-24 LAB — HEPATITIS C ANTIBODY: HCV AB: NEGATIVE

## 2017-04-25 LAB — ANTI-NUCLEAR AB-TITER (ANA TITER)

## 2017-04-25 LAB — ANA: Anti Nuclear Antibody(ANA): POSITIVE — AB

## 2017-04-25 LAB — RHEUMATOID FACTOR: Rhuematoid fact SerPl-aCnc: <14 IU/mL (ref <14)

## 2017-04-27 ENCOUNTER — Telehealth: Payer: Self-pay | Admitting: Internal Medicine

## 2017-04-27 LAB — VITAMIN D 1,25 DIHYDROXY
VITAMIN D 1, 25 (OH) TOTAL: 36 pg/mL (ref 18–72)
VITAMIN D3 1, 25 (OH): 36 pg/mL
Vitamin D2 1, 25 (OH)2: <8 pg/mL

## 2017-04-27 NOTE — Telephone Encounter (Signed)
-  X-rays okay  - mild hyperglycemia A1c 6.4. -ANA positive, 1:80 - hemoglobin minimally decreased. -Other tests okay, vitamin D pending LMOM regards results, I asked for a call back, would like to discuss + ANA. Rheumatology referral?

## 2017-05-03 NOTE — Telephone Encounter (Signed)
Letter printed and mailed to Pt.  

## 2017-05-03 NOTE — Telephone Encounter (Signed)
Pt has not called back, send a letter  Keith Long, Your results showed your sugar is a little high and one of the arthritis markers came back positive, please schedule an appointment to discuss.  sus resultados muestran que tiene la Chief Financial Officerazucar un poco alta y uno de los marcadores de artritis salio positivo. quisiera discutir los [place]ados en persona por favor saque una para las siguientes semanas.

## 2017-08-19 IMAGING — CT CT PARANASAL SINUSES LIMITED
1 of 2 series · 16 of 19 positions shown, 20 images · non-contrast
Comparison: None.

CLINICAL DATA: Chronic nasal congestion, frontal headache, 3 rounds
of antibiotics

EXAM:
CT PARANASAL SINUS LIMITED WITHOUT CONTRAST
TECHNIQUE: Non-contiguous multidetector CT images of the paranasal sinuses were
obtained in a single plane without contrast.

[Series 4: ltd sinus 3.0 h30s · axial · 0.34mm/px · z∈[-70,+25]mm · 16 of 18 slices shown, 20 images]
[im 2/18  brain]
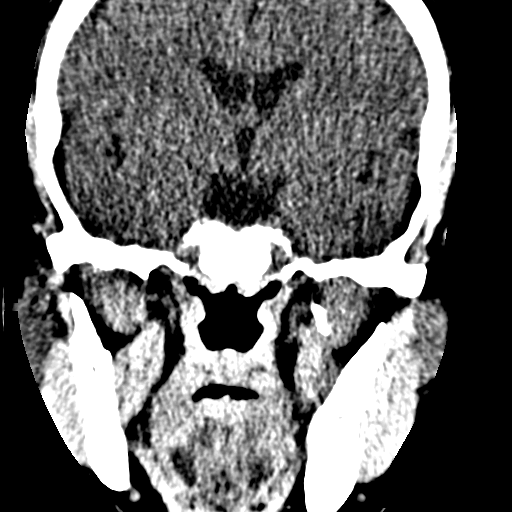
[im 2/18  bone]
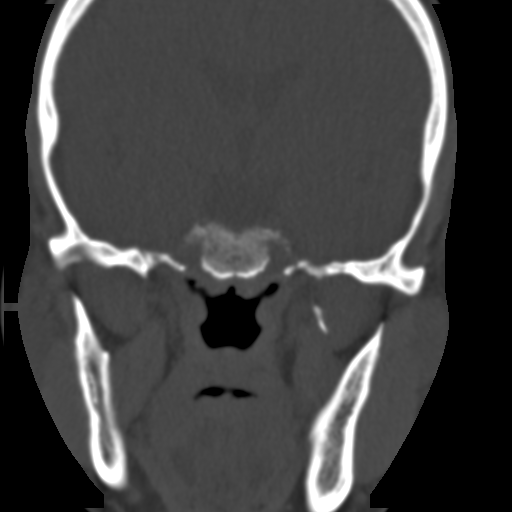
[im 3/18  bone]
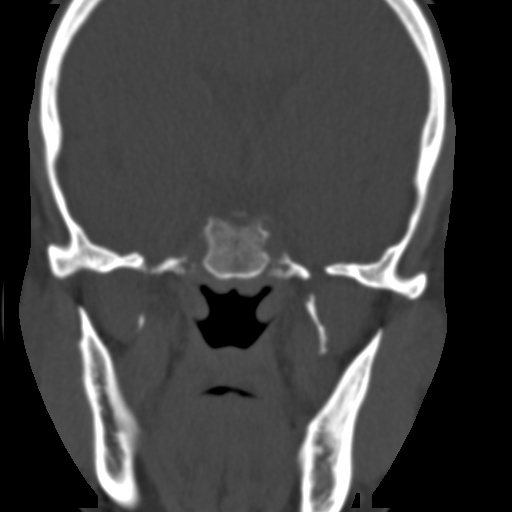
[im 4/18  bone]
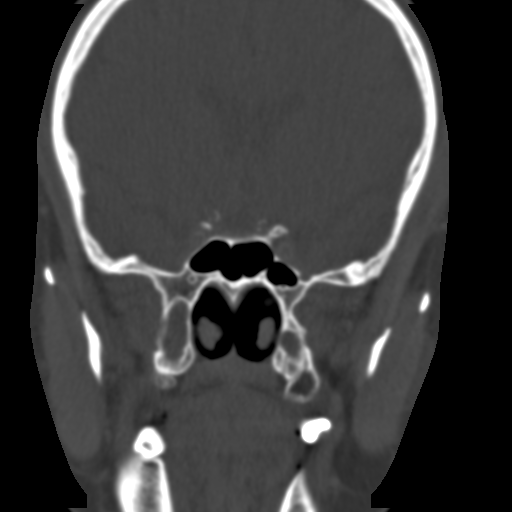
[im 5/18  bone]
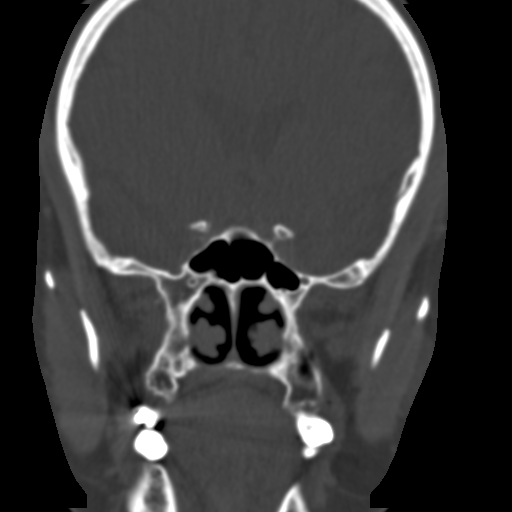
[im 6/18  brain]
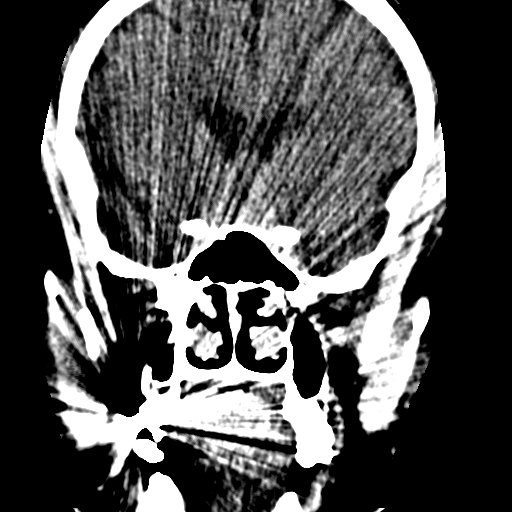
[im 6/18  bone]
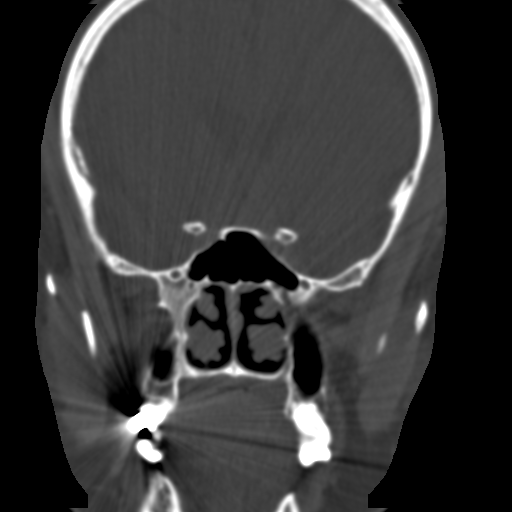
[im 7/18  bone]
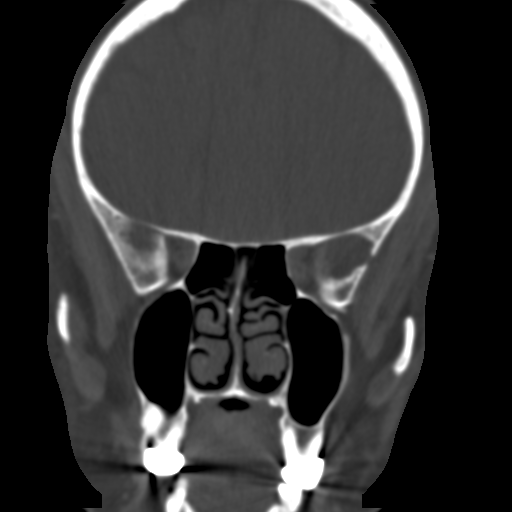
[im 8/18  bone]
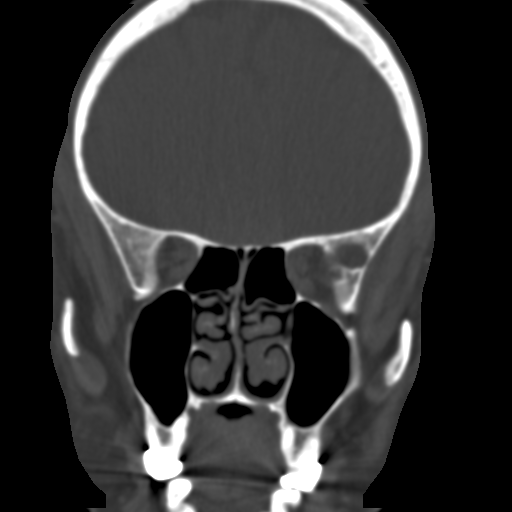
[im 9/18  bone]
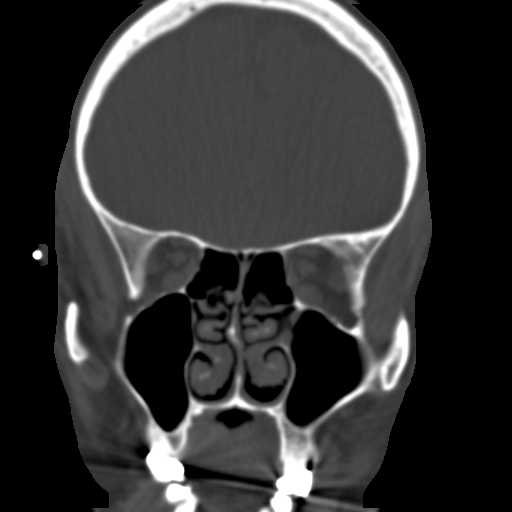
[im 10/18  brain]
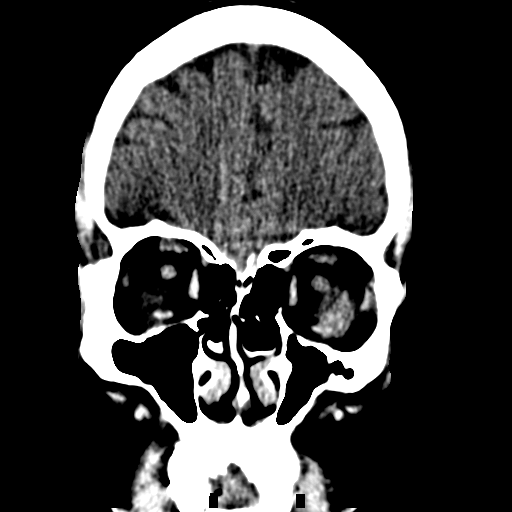
[im 10/18  bone]
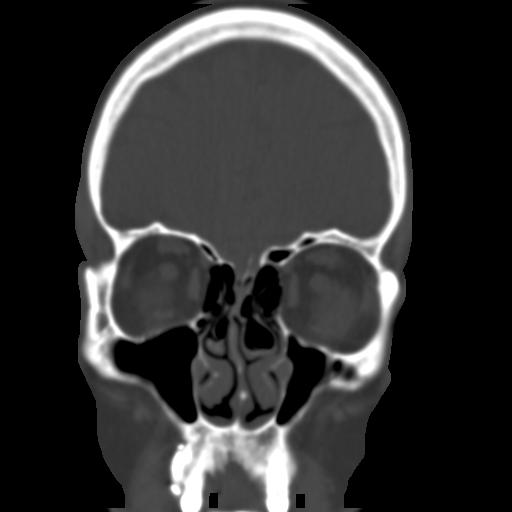
[im 11/18  bone]
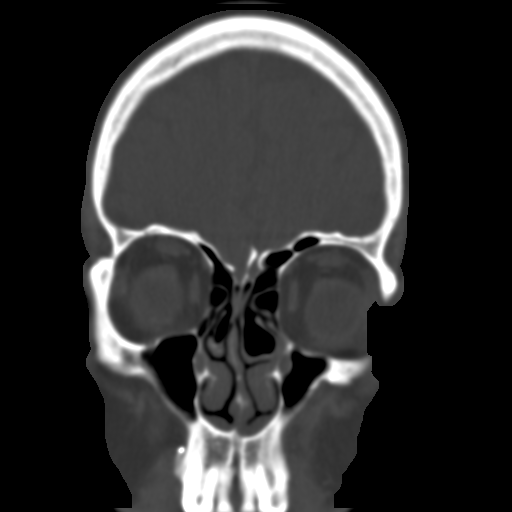
[im 12/18  bone]
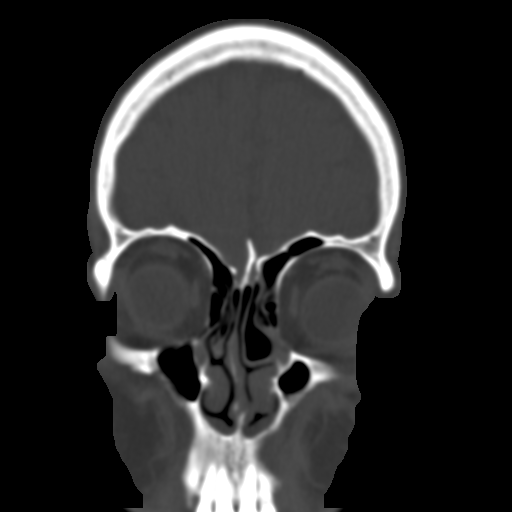
[im 13/18  bone]
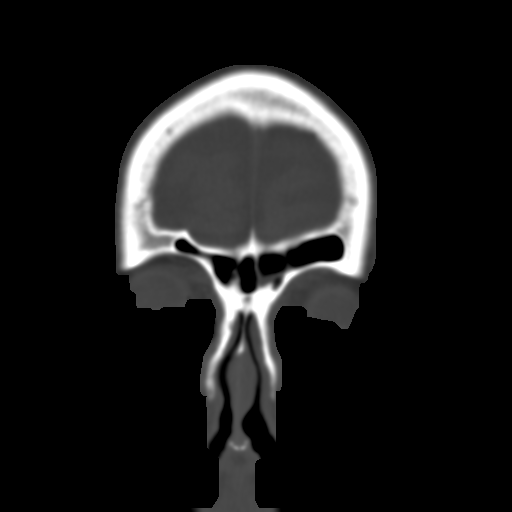
[im 14/18  brain]
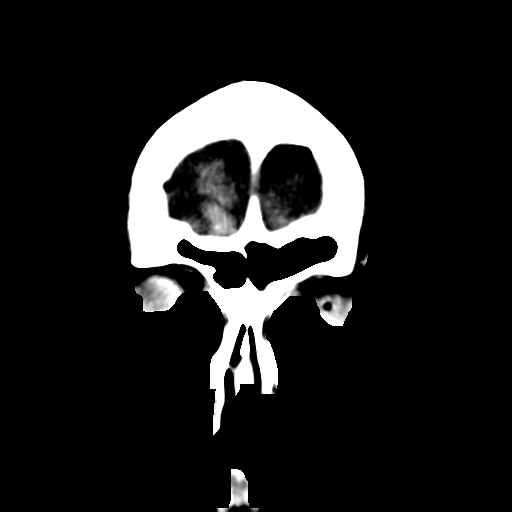
[im 14/18  bone]
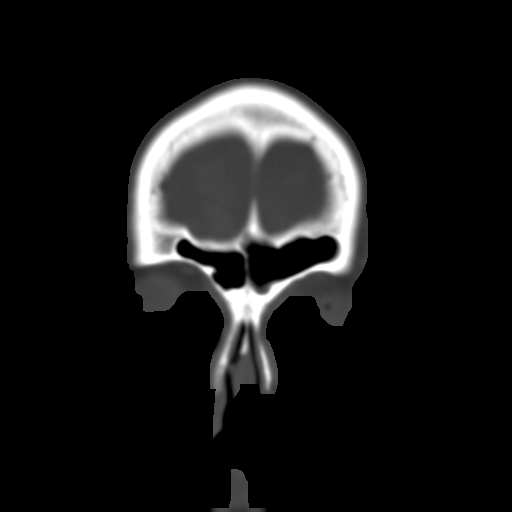
[im 15/18  bone]
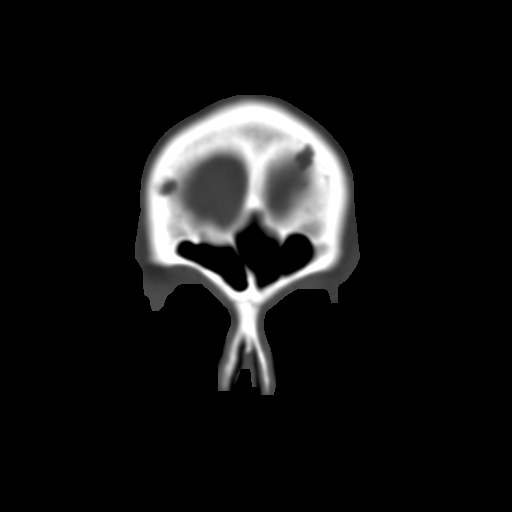
[im 16/18  bone]
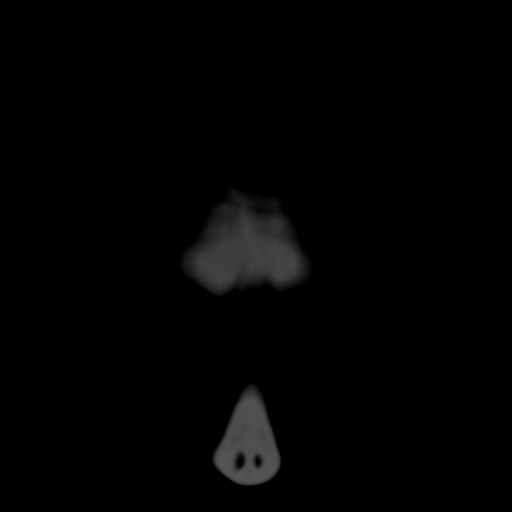
[im 17/18  bone]
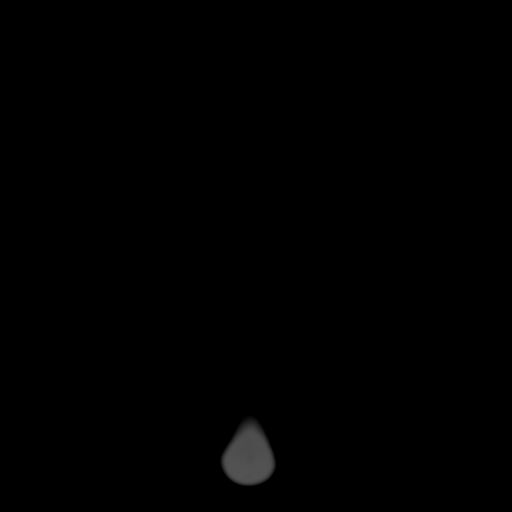

[16 of 19 positions shown; findings below may reference images not displayed]

FINDINGS: There is mild mucosal thickening in the left frontal sinus. However
the remainder of the paranasal sinuses are well pneumatized. No
air-fluid level is seen. The turbinates are prominent and nodular
and there may well be nasal mucosal edema resulting in compromise of
the nasal airway. Some nasal septal deviation is noted to the right.
No bony abnormality is seen.
IMPRESSION: 1. No definite sinusitis.
2. Mild mucosal thickening in the floor of the left maxillary sinus.
3. Compromise of the nasal airway by prominent terminates and
probable nasal mucosal edema.

## 2017-10-19 ENCOUNTER — Encounter: Payer: Self-pay | Admitting: Internal Medicine

## 2017-10-19 ENCOUNTER — Ambulatory Visit: Payer: BLUE CROSS/BLUE SHIELD | Admitting: Internal Medicine

## 2017-10-19 VITALS — BP 138/74 | HR 72 | Temp 97.4°F | Resp 14 | Ht 65.0 in | Wt 162.5 lb

## 2017-10-19 DIAGNOSIS — J309 Allergic rhinitis, unspecified: Secondary | ICD-10-CM

## 2017-10-19 DIAGNOSIS — J301 Allergic rhinitis due to pollen: Secondary | ICD-10-CM

## 2017-10-19 DIAGNOSIS — K4090 Unilateral inguinal hernia, without obstruction or gangrene, not specified as recurrent: Secondary | ICD-10-CM | POA: Diagnosis not present

## 2017-10-19 DIAGNOSIS — J45991 Cough variant asthma: Secondary | ICD-10-CM

## 2017-10-19 DIAGNOSIS — R739 Hyperglycemia, unspecified: Secondary | ICD-10-CM

## 2017-10-19 DIAGNOSIS — R768 Other specified abnormal immunological findings in serum: Secondary | ICD-10-CM

## 2017-10-19 MED ORDER — MONTELUKAST SODIUM 10 MG PO TABS: 10.0000 mg | ORAL_TABLET | Freq: Every day | ORAL | 6 refills | Status: AC

## 2017-10-19 MED ORDER — ALBUTEROL SULFATE HFA 108 (90 BASE) MCG/ACT IN AERS: 2.0000 | INHALATION_SPRAY | Freq: Four times a day (QID) | RESPIRATORY_TRACT | 5 refills | Status: DC | PRN

## 2017-10-19 MED ORDER — FLUTICASONE PROPIONATE 50 MCG/ACT NA SUSP
2.0000 | Freq: Every day | NASAL | 6 refills | Status: DC
Start: 2017-10-19 — End: 2023-03-05

## 2017-10-19 NOTE — Progress Notes (Signed)
Subjective:    Patient ID: Keith Long, male    DOB: 02-Oct-1961, 56 y.o.   MRN: [id number]  DOS:  10/19/2017 Type of visit - description : f/u Interval history: Hyperglycemia: At the last visit, the A1c was  slightly elevated DJD: Continue with pain at different places : some days at the elbows, other days knees or shoulder.  Today reports the symptoms are not severe, he thinks related to his job, he is very active and does occasional lifting.   Was ANA + positive when tested few months ago Has questions about the hernia Asthma allergies: Reports 3 days history of mild sore throat, itchy ears, mucus pooling in the throat.  No fever; he is actually taking no medications for asthma or allergies.  Review of Systems Denies cough or wheezing. No nausea- vomiting  Past Medical History:  Diagnosis Date  . Allergy   . Anemia   . Arthritis   . Asthma    Dr. Sherene SiresWert  . Hemorrhoids   . Rectal bleeding   . Right inguinal hernia 2016   Dr. Sheliah HatchKinsinger    Past Surgical History:  Procedure Laterality Date  . HEMORRHOID SURGERY     local injection  . NOSE SURGERY  1992    Social History   Socioeconomic History  . Marital status: Married    Spouse name: Not on file  . Number of children: 3  . Years of education: Not on file  . Highest education level: Not on file  Social Needs  . Financial resource strain: Not on file  . Food insecurity - worry: Not on file  . Food insecurity - inability: Not on file  . Transportation needs - medical: Not on file  . Transportation needs - non-medical: Not on file  Occupational History  . 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 Smoking status: Never Smoker  . Smokeless tobacco: Never Used  Substance and Sexual Activity  . Alcohol use: Yes    Comment: occasional  . Drug use: No  . Sexual activity: Not on file  Other Topics Concern  . Not on file  Social History Narrative   Original from [place]   Divorced, lives w/  g-friend    3 daughters       Allergies as of 10/19/2017      Reactions   Augmentin [amoxicillin-pot Clavulanate] Nausea Only      Medication List        Accurate as of 10/19/17  4:15 PM. Always use your most recent med list.          albuterol 108 (90 Base) MCG/ACT inhaler Commonly known as:  VENTOLIN HFA Inhale 2 puffs into the lungs every 6 (six) hours as needed for wheezing or shortness of breath.   fluticasone 50 MCG/ACT nasal spray Commonly known as:  FLONASE Place 2 sprays into both nostrils daily.   IRON PO Take by mouth.   mometasone-formoterol 100-5 MCG/ACT Aero Commonly known as:  DULERA Inhale 2 puffs into the lungs 2 (two) times daily.   montelukast 10 MG tablet Commonly known as:  SINGULAIR Take 1 tablet (10 mg total) by mouth at bedtime.   VITAMIN B-12 PO Take by mouth.          Objective:   Physical Exam BP 138/74 (BP Location: Left Arm, Patient Position: Sitting, Cuff Size: Small)   Pulse 72   Temp (!) 97.4 F (36.3 C) (Oral)   Resp 14   Ht  5\' 5"  (1.651 m)   Wt 162 lb 8 oz (73.7 kg)   SpO2 96%   BMI 27.04 kg/m  General:   Well developed, well nourished . NAD.  HEENT:  Normocephalic . Face symmetric, atraumatic.  TMs normal.  Throat symmetric,slightly congested. Lungs:  CTA B Normal respiratory effort, no intercostal retractions, no accessory muscle use. Heart: RRR,  no murmur.  No pretibial edema bilaterally  Skin: Not pale. Not jaundice Neurologic:  alert & oriented X3.  Speech normal, gait appropriate for age and unassisted Psych--  Cognition and judgment appear intact.  Cooperative with normal attention span and concentration.  Behavior appropriate. No anxious or depressed appearing.      Assessment & Plan:   Assessment   Hyperglycemia A1c 6.4  04/2017 Hyperlipidemia Cough variant Asthma -- Dr Sherene SiresWert Nl PFTs 10-2015  - allergy profile 05/11/2015 > Eos 0.5, IgE 208 Pos RAST Grass/trees/ ragweed - Restarted singulair  08/24/2015  - Sinus CT 09/02/2015 1. No definite sinusitis. H/o  flonase intolerance Allergic rhinitis Hemorrhoids status post local injections and a colonoscopy 2014. Still has occasional bleeding Skin lesion R temple, started age 56, no change as off 03-2017  PLAN Hyperglycemia: Recent A1c elevated, d/w the  dx  of hyperglycemia, role of diet and exercise.  Recheck A1c. DJD, see last visit, labs were negative except for a +  ANA.  Currently sx are on and off and seemed to be less intense.  We will recheck a ANA and a sed rate Asthma: Not taking Ventolin or Dulera.  Essentially no sxs.  Will refill Ventolin and d/c Dulera Allergies: As described above, not taking any medication.  Recommend to restart Singulair, Flonase, and OTC Xyzal. Hernia, right side: Patient   Ask "what would happen if I don't get the surgery?" explaining the hernia can get slightly larger and he can develop discomfort and also d/w pt risk of incarceration.  Warning sxs discussed.  He has already been seen and rec surgery by a general surgeon RTC 04/2018 CPX

## 2017-10-19 NOTE — Patient Instructions (Signed)
GO TO THE LAB : Get the blood work     GO TO THE FRONT DESK Schedule your next appointment for a physical exam by 04/2018  For allergies: Flonase daily Singular daily OTC Xyzal 5 mg daily Mucinex DM as needed for cough and congestion  For asthma: Albuterol as needed.

## 2017-10-19 NOTE — Progress Notes (Signed)
Pre visit review using our clinic review tool, if applicable. No additional management support is needed unless otherwise documented below in the visit note. 

## 2017-10-21 NOTE — Assessment & Plan Note (Signed)
Hyperglycemia: Recent A1c elevated, d/w the  dx  of hyperglycemia, role of diet and exercise.  Recheck A1c. DJD, see last visit, labs were negative except for a +  ANA.  Currently sx are on and off and seemed to be less intense.  We will recheck a ANA and a sed rate Asthma: Not taking Ventolin or Dulera.  Essentially no sxs.  Will refill Ventolin and d/c Dulera Allergies: As described above, not taking any medication.  Recommend to restart Singulair, Flonase, and OTC Xyzal. Hernia, right side: Patient   Ask "what would happen if I don't get the surgery?" explaining the hernia can get slightly larger and he can develop discomfort and also d/w pt risk of incarceration.  Warning sxs discussed.  He has already been seen and rec surgery by a general surgeon RTC 04/2018 CPX

## 2017-10-22 LAB — ANTI-NUCLEAR AB-TITER (ANA TITER)

## 2017-10-22 LAB — HEMOGLOBIN A1C
HEMOGLOBIN A1C: 5.9 %{Hb} — AB (ref <5.7)
Mean Plasma Glucose: 123 (calc)
eAG (mmol/L): 6.8 (calc)

## 2017-10-22 LAB — ANA: ANA: POSITIVE — AB

## 2017-10-22 LAB — SEDIMENTATION RATE: Sed Rate: 9 mm/h (ref 0–20)

## 2017-11-23 ENCOUNTER — Ambulatory Visit: Payer: Self-pay | Admitting: *Deleted

## 2017-11-23 NOTE — Telephone Encounter (Signed)
Patient is calling to reports he has cough, congestion and asthma. He is using his inhaler and he states he is having difficulty at night. He has gotten worse over the last 3 days and is sputum is green/yellow. He had chills and sweats last night. Reason for Disposition . [1] Known COPD or other severe lung disease (i.e., bronchiectasis, cystic fibrosis, lung surgery) AND [2] worsening symptoms (i.e., increased sputum purulence or amount, increased breathing difficulty  Answer Assessment - Initial Assessment Questions 1. ONSET: "When did the cough begin?"      1 1/2 weeks ago 2. SEVERITY: "How bad is the cough today?"      Cough is bad- he is using the inhaler- it gets worse at night 3. RESPIRATORY DISTRESS: "Describe your breathing."      Uses the inhaler- tired at night 4. FEVER: "Do you have a fever?" If so, ask: "What is your temperature, how was it measured, and when did it start?"     No- last night chills/sweating 5. SPUTUM: "Describe the color of your sputum" (clear, white, yellow, green)     Green/yellow 6. HEMOPTYSIS: "Are you coughing up any blood?" If so ask: "How much?" (flecks, streaks, tablespoons, etc.)     no 7. CARDIAC HISTORY: "Do you have any history of heart disease?" (e.g., heart attack, congestive heart failure)      no 8. LUNG HISTORY: "Do you have any history of lung disease?"  (e.g., pulmonary embolus, asthma, emphysema)     asthma 9. PE RISK FACTORS: "Do you have a history of blood clots?" (or: recent major surgery, recent prolonged travel, bedridden )     no 10. OTHER SYMPTOMS: "Do you have any other symptoms?" (e.g., runny nose, wheezing, chest pain)       Congestion, wheezing, coughing, head hurts when coughs 11. PREGNANCY: "Is there any chance you are pregnant?" "When was your last menstrual period?"       n/a 12. TRAVEL: "Have you traveled out of the country in the last month?" (e.g., travel history, exposures)       n/a  Protocols used: COUGH - ACUTE  PRODUCTIVE-A-AH

## 2017-11-24 ENCOUNTER — Encounter: Payer: Self-pay | Admitting: Family Medicine

## 2017-11-24 ENCOUNTER — Ambulatory Visit: Payer: BLUE CROSS/BLUE SHIELD | Admitting: Family Medicine

## 2017-11-24 VITALS — BP 128/82 | HR 84 | Temp 98.2°F | Wt 164.0 lb

## 2017-11-24 DIAGNOSIS — J4541 Moderate persistent asthma with (acute) exacerbation: Secondary | ICD-10-CM | POA: Insufficient documentation

## 2017-11-24 MED ORDER — GUAIFENESIN-CODEINE 100-10 MG/5ML PO SYRP: 5.0000 mL | ORAL_SOLUTION | Freq: Every evening | ORAL | 0 refills | Status: DC | PRN

## 2017-11-24 MED ORDER — METHYLPREDNISOLONE ACETATE 40 MG/ML IJ SUSP
40.0000 mg | Freq: Once | INTRAMUSCULAR | Status: AC
  Administered 2017-11-24: 40 mg via INTRAMUSCULAR

## 2017-11-24 NOTE — Patient Instructions (Signed)
Giuven steroid injection.  Use albuterol as needed and cough suppressant at night.  If severe shortness of breath go to ER.  If not improving as expected follow up with PCP next week.

## 2017-11-24 NOTE — Assessment & Plan Note (Signed)
Offered pred taper.. Pt requested injection of dexamethasone  As helped the most in past.  Albuterol prn.   Most liekly viral trigger.. No sign of bacterial infection

## 2017-11-24 NOTE — Addendum Note (Signed)
Addended by: Roena MaladyEVONTENNO, MELANIE Y on: 11/24/2017 01:44 PM   Modules accepted: Orders

## 2017-11-24 NOTE — Progress Notes (Signed)
   Subjective:    Patient ID: Keith Long, male    DOB: August 27, 1961, 57 y.o.   MRN: [id number]  Cough  This is a new problem. The current episode started in the past 7 days ( 4 days). The problem has been gradually worsening. The cough is productive of sputum. Associated symptoms include chest pain, chills, ear pain, nasal congestion, rhinorrhea, a sore throat and wheezing. Pertinent negatives include no fever. Associated symptoms comments: Left ear pain Right chest pain  right head pain when coughing. The symptoms are aggravated by lying down ( cannot slee(p at night). Risk factors: nonsmoker. He has tried a beta-agonist inhaler for the symptoms. The treatment provided mild relief. His past medical history is significant for asthma and environmental allergies.  Sore Throat   Associated symptoms include coughing, ear pain and a plugged ear sensation.  Ear Fullness   Associated symptoms include coughing, rhinorrhea and a sore throat.   Not using flonase as it gives him rash.  Blood pressure 128/82, pulse 84, temperature 98.2 F (36.8 C), temperature source Oral, weight 164 lb (74.4 kg), SpO2 96 %.   Review of Systems  Constitutional: Positive for chills. Negative for fever.  HENT: Positive for ear pain, rhinorrhea and sore throat.   Respiratory: Positive for cough and wheezing.   Cardiovascular: Positive for chest pain.  Allergic/Immunologic: Positive for environmental allergies.       Objective:   Physical Exam  Constitutional: Vital signs are normal. He appears well-developed and well-nourished.  Non-toxic appearance. He does not appear ill. No distress.  HENT:  Head: Normocephalic and atraumatic.  Right Ear: Hearing, tympanic membrane, external ear and ear canal normal. No tenderness. No foreign bodies. Tympanic membrane is not retracted and not bulging.  Left Ear: Hearing, tympanic membrane, external ear and ear canal normal. No tenderness. No foreign bodies. Tympanic membrane  is not retracted and not bulging.  Nose: Nose normal. No mucosal edema or rhinorrhea. Right sinus exhibits no maxillary sinus tenderness and no frontal sinus tenderness. Left sinus exhibits no maxillary sinus tenderness and no frontal sinus tenderness.  Mouth/Throat: Uvula is midline, oropharynx is clear and moist and mucous membranes are normal. Normal dentition. No dental caries. No oropharyngeal exudate or tonsillar abscesses.  Eyes: Conjunctivae, EOM and lids are normal. Pupils are equal, round, and reactive to light. Lids are everted and swept, no foreign bodies found.  Neck: Trachea normal, normal range of motion and phonation normal. Neck supple. Carotid bruit is not present. No thyroid mass and no thyromegaly present.  Cardiovascular: Normal rate, regular rhythm, S1 normal, S2 normal, normal heart sounds, intact distal pulses and normal pulses. Exam reveals no gallop.  No murmur heard. Pulmonary/Chest: Effort normal. No respiratory distress. He has wheezes in the right upper field, the right middle field, the right lower field, the left upper field, the left middle field and the left lower field. He has no rhonchi. He has no rales.  Abdominal: Soft. Normal appearance and bowel sounds are normal. There is no hepatosplenomegaly. There is no tenderness. There is no rebound, no guarding and no CVA tenderness. No hernia.  Neurological: He is alert. He has normal reflexes.  Skin: Skin is warm, dry and intact. No rash noted.  Psychiatric: He has a normal mood and affect. His speech is normal and behavior is normal. Judgment normal.          Assessment & Plan:

## 2018-01-28 ENCOUNTER — Ambulatory Visit: Payer: BLUE CROSS/BLUE SHIELD | Admitting: Family Medicine

## 2018-01-28 ENCOUNTER — Encounter: Payer: Self-pay | Admitting: Family Medicine

## 2018-01-28 VITALS — BP 120/80 | HR 84 | Temp 97.8°F | Ht 65.0 in | Wt 163.1 lb

## 2018-01-28 DIAGNOSIS — J320 Chronic maxillary sinusitis: Secondary | ICD-10-CM

## 2018-01-28 MED ORDER — METHYLPREDNISOLONE ACETATE 80 MG/ML IJ SUSP
80.0000 mg | Freq: Once | INTRAMUSCULAR | Status: AC
  Administered 2018-01-28: 80 mg via INTRAMUSCULAR

## 2018-01-28 MED ORDER — LEVOCETIRIZINE DIHYDROCHLORIDE 5 MG PO TABS: 5.0000 mg | ORAL_TABLET | Freq: Every evening | ORAL | 5 refills | Status: DC

## 2018-01-28 MED ORDER — DOXYCYCLINE HYCLATE 100 MG PO TABS: 100.0000 mg | ORAL_TABLET | Freq: Two times a day (BID) | ORAL | 0 refills | Status: AC

## 2018-01-28 NOTE — Patient Instructions (Signed)
Call me in 1 month if this is not getting better on the new medicine. I will refer you to an allergist since you have already seen an ENT specialist.   OK to take Tylenol 1000 mg (2 extra strength tabs) or 975 mg (3 regular strength tabs) every 6 hours as needed.  Ibuprofen 400-600 mg (2-3 over the counter strength tabs) every 6 hours as needed for pain.  Continue to push fluids, practice good hand hygiene, and cover your mouth if you cough.  If you start having fevers, shaking or shortness of breath, seek immediate care.  Let us know if you need anything.

## 2018-01-28 NOTE — Addendum Note (Signed)
Addended by: Scharlene GlossEWING, Keil Pickering B on: 01/28/2018 07:59 AM   Modules accepted: Orders

## 2018-01-28 NOTE — Progress Notes (Signed)
Chief Complaint  Patient presents with  . Sinusitis  . Headache    Keith Long here for URI complaints.  Duration: 8 days has gotten worse, this has been going on chronically, >3 mo Associated symptoms: sinus congestion, sinus pain, rhinorrhea, itchy watery eyes, ear fullness and cough Denies: ear pain, ear drainage, sore throat, wheezing, shortness of breath, myalgia and fever Treatment to date: INCS Sick contacts: No  ROS:  Const: Denies fevers HEENT: As noted in HPI Lungs: No SOB  Past Medical History:  Diagnosis Date  . Allergy   . Anemia   . Arthritis   . Asthma    Dr. Sherene SiresWert  . Hemorrhoids   . Rectal bleeding   . Right inguinal hernia 2016   Dr. Sheliah HatchKinsinger   Family History  Problem Relation Age of Onset  . Diabetes Mother   . Stroke Father   . Colon cancer Neg Hx   . Prostate cancer Neg Hx    Past Surgical History:  Procedure Laterality Date  . HEMORRHOID SURGERY     local injection  . NOSE SURGERY  1992   Allergies  Allergen Reactions  . Augmentin [Amoxicillin-Pot Clavulanate] Nausea Only     BP 120/80 (BP Location: Left Arm, Patient Position: Sitting, Cuff Size: Normal)   Pulse 84   Temp 97.8 F (36.6 C) (Oral)   Ht 5\' 5"  (1.651 m)   Wt 163 lb 2 oz (74 kg)   SpO2 98%   BMI 27.15 kg/m  General: Awake, alert, appears stated age HEENT: AT, [place], ears patent b/l and TM's neg, nares patent w/o discharge, Turb enlarged b/l not pale, max sinuses ttp, worse on L, pharynx pink and without exudates, MMM Neck: No masses or asymmetry Heart: RRR Lungs: CTAB, no accessory muscle use Psych: Age appropriate judgment and insight, normal mood and affect  Maxillary sinusitis, unspecified chronicity - Plan: doxycycline (VIBRA-TABS) 100 MG tablet, levocetirizine (XYZAL) 5 MG tablet  Orders as above. Acute on chronic. He has seen ENT before. I think he should trial PO antihistamine for 1 mo. If no better, will refer to allergy team. Go back on Singulair, cont INCS.   Continue to push fluids, practice good hand hygiene, cover mouth when coughing. F/u prn. If starting to experience fevers, shaking, or shortness of breath, seek immediate care. Pt voiced understanding and agreement to the plan.  Keith Long Keith Beech GroveWendling, DO 01/28/18 7:50 AM

## 2018-01-28 NOTE — Progress Notes (Signed)
Pre visit review using our clinic review tool, if applicable. No additional management support is needed unless otherwise documented below in the visit note. 

## 2018-01-29 ENCOUNTER — Ambulatory Visit: Payer: BLUE CROSS/BLUE SHIELD | Admitting: Internal Medicine

## 2018-03-22 ENCOUNTER — Telehealth: Payer: Self-pay | Admitting: Internal Medicine

## 2018-03-22 DIAGNOSIS — H35719 Central serous chorioretinopathy, unspecified eye: Secondary | ICD-10-CM | POA: Insufficient documentation

## 2018-03-22 NOTE — Telephone Encounter (Signed)
Received a note from ophthalmology, the patient was seen by Dr. Lita Mains at the [facility] specialist on 02/11/2018. 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 He is at risk of recurrence if he gets more steroids. Will place steroids injections in his "allergy" list

## 2018-04-26 ENCOUNTER — Encounter: Payer: BLUE CROSS/BLUE SHIELD | Admitting: Internal Medicine

## 2019-06-23 ENCOUNTER — Encounter: Payer: Self-pay | Admitting: Internal Medicine

## 2021-07-27 ENCOUNTER — Encounter: Payer: Self-pay | Admitting: Gastroenterology

## 2021-08-01 ENCOUNTER — Ambulatory Visit (INDEPENDENT_AMBULATORY_CARE_PROVIDER_SITE_OTHER): Payer: Medicaid Other | Admitting: Gastroenterology

## 2021-08-01 ENCOUNTER — Encounter: Payer: Self-pay | Admitting: Gastroenterology

## 2021-08-01 VITALS — BP 150/78 | HR 88 | Ht 65.0 in | Wt 169.1 lb

## 2021-08-01 DIAGNOSIS — K219 Gastro-esophageal reflux disease without esophagitis: Secondary | ICD-10-CM

## 2021-08-01 MED ORDER — RABEPRAZOLE SODIUM 20 MG PO TBEC
20.0000 mg | DELAYED_RELEASE_TABLET | Freq: Two times a day (BID) | ORAL | 3 refills | Status: DC
Start: 2021-08-01 — End: 2021-11-18

## 2021-08-01 NOTE — Patient Instructions (Signed)
If you are age 58 or older, your body mass index should be between 23-30. Your Body mass index is 28.14 kg/m. If this is out of the aforementioned range listed, please consider follow up with your Primary Care Provider.  If you are age 46 or younger, your body mass index should be between 19-25. Your Body mass index is 28.14 kg/m. If this is out of the aformentioned range listed, please consider follow up with your Primary Care Provider.   We have sent the following medications to your pharmacy for you to pick up at your convenience: Aciphex 20 mg twice daily .  You have been scheduled for an endoscopy. Please follow written instructions given to you at your visit today. If you use inhalers (even only as needed), please bring them with you on the day of your procedure.   The [facility] providers would like to encourage you to use [facility] to communicate with providers for non-urgent requests or questions.  Due to long hold times on the telephone, sending your provider a message by [facility] may be a faster and more efficient way to get a response.  Please allow 48 business hours for a response.  Please remember that this is for non-urgent requests.   It was a pleasure to see you today!  Thank you for trusting me with your gastrointestinal care!    Scott E. Tomasa Rand, MD

## 2021-08-01 NOTE — Progress Notes (Signed)
HPI : Keith Long is a very pleasant 60 year old male who presents to [place] for further evaluation of GERD symptoms.  Patient states he has had GERD symptoms for many years, but they have been getting much worse in the past few months.  Currently, he is having symptoms every single day.  His symptoms consist of burning pain in his chest, chest pressure and discomfort and acid regurgitation.  His symptoms frequently wake him from sleep.  He has started to eat dinner earlier in the day, and now he eats at about 4:00 because of his nighttime symptoms.  He has epigastric pain as well.  He denies symptoms of dysphagia, nausea or vomiting.  His weight has increased a little bit over the past year from 150 259 pounds.  He takes Prilosec daily (takes it at night before bed) which he says helps his symptoms in terms of reducing the severity.  However, he continues to be very bothered despite daily Prilosec use. He denies any chronic lower GI symptoms.  He had a screening colonoscopy at age 29, which she says was normal and he was recommended to repeat in 10 years.   Past Medical History:  Diagnosis Date   Allergy    Anemia    Arthritis    Asthma    Dr. Sherene Sires   Hemorrhoids    Rectal bleeding    Right inguinal hernia 2016   Dr. Sheliah Hatch     Past Surgical History:  Procedure Laterality Date   HEMORRHOID SURGERY     local injection   NOSE SURGERY  1992   Family History  Problem Relation Age of Onset   Diabetes Mother    Stroke Father    Colon cancer Neg Hx    Prostate cancer Neg Hx    Social History   Tobacco Use   Smoking status: Never   Smokeless tobacco: Never  Substance Use Topics   Alcohol use: Yes    Comment: occasional   Drug use: No   Current Outpatient Medications  Medication Sig Dispense Refill   ADVAIR HFA 115-21 MCG/ACT inhaler Inhale 2 puffs into the lungs 2 (two) times daily.     albuterol (VENTOLIN HFA) 108 (90 Base) MCG/ACT inhaler Inhale 2 puffs into the lungs every 6  (six) hours as needed for wheezing or shortness of breath. 18 g 5   Cyanocobalamin (VITAMIN B-12 PO) Take by mouth.     fluticasone (FLONASE) 50 MCG/ACT nasal spray Place 2 sprays into both nostrils daily. 16 g 6   hydrocortisone (ANUSOL-HC) 2.5 % rectal cream Apply topically.     ipratropium-albuterol (DUONEB) 0.5-2.5 (3) MG/3ML SOLN Inhale into the lungs.     IRON PO Take by mouth.     levocetirizine (XYZAL) 5 MG tablet Take 1 tablet (5 mg total) by mouth every evening. 30 tablet 5   loratadine (CLARITIN) 10 MG tablet Take by mouth.     montelukast (SINGULAIR) 10 MG tablet Take 1 tablet (10 mg total) by mouth at bedtime. 30 tablet 6   omeprazole (PRILOSEC) 10 MG capsule Take by mouth.     No current facility-administered medications for this visit.   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  See ophthalmology note from 02/11/2018     Review of Systems: All systems reviewed and  negative except where noted in HPI.    No results found.  Physical Exam: Ht 5\' 5"  (1.651 m)   Wt 169 lb 2 oz (76.7 kg)   BMI 28.14 kg/m  Constitutional: Pleasant,well-developed, Hispanic male in no acute distress.  Accompanied by his daughter.  The patient speaks English well, but his daughter helps clarify any confusion HEENT: Normocephalic and atraumatic. Conjunctivae are normal. No scleral icterus. Cardiovascular: Normal rate, regular rhythm.  Pulmonary/chest: Effort normal and breath sounds normal. No wheezing, rales or rhonchi. Abdominal: Soft, nondistended, nontender. Bowel sounds active throughout. There are no masses palpable. No hepatomegaly. Extremities: no edema Neurological: Alert and oriented to person place and time. Skin: Skin is warm and dry. No rashes noted. Psychiatric: Normal mood  and affect. Behavior is normal.  CBC    Component Value Date/Time   WBC 5.6 04/24/2017 0708   RBC 4.82 04/24/2017 0708   HGB 12.7 (L) 04/24/2017 0708   HCT 39.0 04/24/2017 0708   PLT 277.0 04/24/2017 0708   MCV 81.0 04/24/2017 0708   MCHC 32.5 04/24/2017 0708   RDW 17.3 (H) 04/24/2017 0708   LYMPHSABS 2.3 04/24/2017 0708   MONOABS 0.4 04/24/2017 0708   EOSABS 0.5 04/24/2017 0708   BASOSABS 0.1 04/24/2017 0708    CMP     Component Value Date/Time   NA 139 04/24/2017 0708   K 4.0 04/24/2017 0708   CL 106 04/24/2017 0708   CO2 27 04/24/2017 0708   GLUCOSE 100 (H) 04/24/2017 0708   BUN 11 04/24/2017 0708   CREATININE 0.83 04/24/2017 0708   CALCIUM 9.7 04/24/2017 0708   PROT 7.4 04/24/2017 0708   ALBUMIN 4.3 04/24/2017 0708   AST 20 04/24/2017 0708   ALT 22 04/24/2017 0708   ALKPHOS 81 04/24/2017 0708   BILITOT 0.4 04/24/2017 0708     ASSESSMENT AND PLAN: 59 year old male with typical GERD symptoms improved but not controlled with once daily Prilosec.  We discussed the pathophysiology of GERD and the principles of GERD management to include lifestyle modifications  such as dietary discretion (avoidance of alcohol, tobacco, caffeinated and carbonated beverages, spicy/greasy foods, citrus, peppermint/chocolate), weight loss if applicable, head of bed elevation andconsuming last meal of day within 3 hours of bedtime; pharmacologic options to include PPIs, H2RAs and OTC antacids; and finally surgical or endoscopic fundoplication. For now, I recommended we switch his PPI to Aciphex 20 mg p.o. daily, 30 to 45 minutes before meals.  We will perform an upper endoscopy to assess his anatomy and to assess for complications of GERD.  GERD - Aciphex 20 mg p.o. twice daily - EGD - Patient to continue behavioral modifications to help with GERD  Colon cancer screening - Screening colonoscopy in October 2023  The details, risks (including bleeding, perforation, infection, missed  lesions, medication reactions and possible hospitalization or surgery if complications occur), benefits, and alternatives to EGD with possible biopsy and possible dilation were discussed with the patient and he consents to proceed.   Baudelia Schroepfer E. November 2023, MD [facility]   No ref. provider found

## 2021-08-02 ENCOUNTER — Encounter: Payer: Medicaid Other | Admitting: Gastroenterology

## 2021-08-05 ENCOUNTER — Encounter: Payer: Self-pay | Admitting: Gastroenterology

## 2021-08-05 ENCOUNTER — Ambulatory Visit (AMBULATORY_SURGERY_CENTER): Payer: Medicaid Other | Admitting: Gastroenterology

## 2021-08-05 ENCOUNTER — Other Ambulatory Visit: Payer: Self-pay

## 2021-08-05 VITALS — BP 112/62 | HR 79 | Temp 98.1°F | Resp 14 | Ht 65.0 in | Wt 169.0 lb

## 2021-08-05 DIAGNOSIS — K2100 Gastro-esophageal reflux disease with esophagitis, without bleeding: Secondary | ICD-10-CM | POA: Diagnosis not present

## 2021-08-05 DIAGNOSIS — K449 Diaphragmatic hernia without obstruction or gangrene: Secondary | ICD-10-CM

## 2021-08-05 DIAGNOSIS — K219 Gastro-esophageal reflux disease without esophagitis: Secondary | ICD-10-CM

## 2021-08-05 MED ORDER — SODIUM CHLORIDE 0.9 % IV SOLN: 500.0000 mL | Freq: Once | INTRAVENOUS | Status: DC

## 2021-08-05 NOTE — Progress Notes (Signed)
History and Physical Interval Note: No changes to patient's medical history or symptoms since his clinic visit Sept 12th  08/05/2021 10:02 AM  Keith Long  has presented today for endoscopic procedure(s), with the diagnosis of  Encounter Diagnosis  Name Primary?   Gastroesophageal reflux disease, unspecified whether esophagitis present Yes  .  The various methods of evaluation and treatment have been discussed with the patient and/or family. After consideration of risks, benefits and other options for treatment, the patient has consented to  the endoscopic procedure(s).   The patient's history has been reviewed, patient examined, no change in status, stable for endoscopic procedure(s).  I have reviewed the patient's chart and labs.  Questions were answered to the patient's satisfaction.     Darcia Lampi E. Tomasa Rand, MD [facility]

## 2021-08-05 NOTE — Progress Notes (Signed)
VS- Keith Long 

## 2021-08-05 NOTE — Patient Instructions (Signed)
Read all of the handouts given to you by your recovery room nurse.  YOU HAD AN ENDOSCOPIC PROCEDURE TODAY AT THE [facility]:   Refer to the procedure report that was given to you for any specific questions about what was found during the examination.  If the procedure report does not answer your questions, please call your gastroenterologist to clarify.  If you requested that your care partner not be given the details of your procedure findings, then the procedure report has been included in a sealed envelope for you to review at your convenience later.  YOU SHOULD EXPECT: Some feelings of bloating in the abdomen. Passage of more gas than usual.  Walking can help get rid of the air that was put into your GI tract during the procedure and reduce the bloating.   Please Note:  You might notice some irritation and congestion in your nose or some drainage.  This is from the oxygen used during your procedure.  There is no need for concern and it should clear up in a day or so.  SYMPTOMS TO REPORT IMMEDIATELY:   Fever of 100F or higher  Following upper endoscopy (EGD)  Vomiting of blood or coffee ground material  New chest pain or pain under the shoulder blades  Painful or persistently difficult swallowing  New shortness of breath  Fever of 100F or higher  Black, tarry-looking stools  For urgent or emergent issues, a gastroenterologist can be reached at any hour by calling [phone]. Do not use MyChart messaging for urgent concerns.    DIET:  We do recommend a small meal at first, but then you may proceed to your regular diet.  Drink plenty of fluids but you should avoid alcoholic beverages for 24 hours.  ACTIVITY:  You should plan to take it easy for the rest of today and you should NOT DRIVE or use heavy machinery until tomorrow (because of the sedation medicines used during the test).    FOLLOW UP: Our staff will call the number listed on your records 48-72 hours  following your procedure to check on you and address any questions or concerns that you may have regarding the information given to you following your procedure. If we do not reach you, we will leave a message.  We will attempt to reach you two times.  During this call, we will ask if you have developed any symptoms of COVID 19. If you develop any symptoms (ie: fever, flu-like symptoms, shortness of breath, cough etc.) before then, please call [phone].  If you test positive for Covid 19 in the 2 weeks post procedure, please call and report this information to [place].      SIGNATURES/CONFIDENTIALITY: You and/or your care partner have signed paperwork which will be entered into your electronic medical record.  These signatures attest to the fact that that the information above on your After Visit Summary has been reviewed and is understood.  Full responsibility of the confidentiality of this discharge information lies with you and/or your care-partner.

## 2021-08-05 NOTE — Op Note (Signed)
[facility] Patient Name: Keith Long Procedure Date: 08/05/2021 9:48 AM MRN: [id number] Endoscopist: Lorin Picket E. Tomasa Rand , MD Age: 60 Referring MD:  Date of Birth: 01-Apr-1961 Gender: Male Account #: [id number] Procedure:                Upper GI endoscopy Indications:              Heartburn, Suspected esophageal reflux Medicines:                Monitored Anesthesia Care Procedure:                Pre-Anesthesia Assessment:                           - Prior to the procedure, a History and Physical                            was performed, and patient medications and                            allergies were reviewed. The patient's tolerance of                            previous anesthesia was also reviewed. The risks                            and benefits of the procedure and the sedation                            options and risks were discussed with the patient.                            All questions were answered, and informed consent                            was obtained. Prior Anticoagulants: The patient has                            taken no previous anticoagulant or antiplatelet                            agents. ASA Grade Assessment: II - A patient with                            mild systemic disease. After reviewing the risks                            and benefits, the patient was deemed in                            satisfactory condition to undergo the procedure.                           After obtaining informed consent, the endoscope was  passed under direct vision. Throughout the                            procedure, the patient's blood pressure, pulse, and                            oxygen saturations were monitored continuously. The                            Endoscope was introduced through the mouth, and                            advanced to the third part of duodenum. The upper                            GI endoscopy  was accomplished without difficulty.                            The patient tolerated the procedure well. Scope In: Scope Out: Findings:                 The examined portions of the nasopharynx,                            oropharynx and larynx were normal.                           The Z-line was irregular                           LA Grade B (one or more mucosal breaks greater than                            5 mm, not extending between the tops of two mucosal                            folds) esophagitis with no bleeding was found 30 to                            36 cm from the incisors.                           The exam of the esophagus was otherwise normal.                           A 2 cm hiatal hernia was present.                           The exam of the stomach was otherwise normal.                           The examined duodenum was normal. Complications:            No immediate complications. Estimated Blood Loss:     Estimated blood loss: none. Impression:               -  The examined portions of the nasopharynx,                            oropharynx and larynx were normal.                           - Z-line irregular.                           - LA Grade B reflux esophagitis with no bleeding.                           - 2 cm hiatal hernia.                           - Normal examined duodenum.                           - No specimens collected. Recommendation:           - Patient has a contact number available for                            emergencies. The signs and symptoms of potential                            delayed complications were discussed with the                            patient. Return to normal activities tomorrow.                            Written discharge instructions were provided to the                            patient.                           - Resume previous diet.                           - Continue present medications (Aciphex 20 mg PO                             BID).                           - Repeat upper endoscopy in 8 weeks to check                            healing.                           - Continue behavioral modifications to improve GERD                            symptoms (avoidance of meals within 3 hours of  bedtime, head of bed elevation, avoidance of                            provocative foods/alcohol, weight loss) Keith Long E. Tomasa Rand, MD 08/05/2021 10:18:10 AM This report has been signed electronically.

## 2021-08-05 NOTE — Progress Notes (Signed)
Pt in recovery with monitors in place, VSS. Report given to receiving RN. Bite guard was placed with pt awake to ensure comfort. No dental or soft tissue damage noted. 

## 2021-08-09 ENCOUNTER — Telehealth: Payer: Self-pay | Admitting: *Deleted

## 2021-08-09 NOTE — Telephone Encounter (Signed)
Message left

## 2021-08-09 NOTE — Telephone Encounter (Signed)
  Follow up Call-  Call back number 08/05/2021  Post procedure Call Back phone  # [phone]  Permission to leave phone message Yes  Some recent data might be hidden     Patient questions:  Do you have a fever, pain , or abdominal swelling? No. Pain Score  0 *  Have you tolerated food without any problems? Yes.    Have you been able to return to your normal activities? Yes.    Do you have any questions about your discharge instructions: Diet   No. Medications  No. Follow up visit  No.  Do you have questions or concerns about your Care? No.  Actions: * If pain score is 4 or above: No action needed, pain <4. Have you developed a fever since your procedure? no  2.   Have you had an respiratory symptoms (SOB or cough) since your procedure? no  3.   Have you tested positive for COVID 19 since your procedure no  4.   Have you had any family members/close contacts diagnosed with the COVID 19 since your procedure?  no   If yes to any of these questions please route to Laverna Peace, RN and Karlton Lemon, RN

## 2021-09-20 ENCOUNTER — Telehealth: Payer: Self-pay | Admitting: Gastroenterology

## 2021-09-20 NOTE — Telephone Encounter (Signed)
Inbound call from patient daughter, Toniann Fail. Called in about recall egd that should be scheduled for November but wanted to ask if he could have the colonoscopy at the same time. He is not currently scheduled for either procedure.

## 2021-09-20 NOTE — Telephone Encounter (Signed)
Dr. Tomasa Rand this pts daughter is wanting to know if he can be scheduled for a colon at the time of his next EGD. He is currently scheduled for an OV toward the end of November. Please advise.

## 2021-09-21 NOTE — Telephone Encounter (Signed)
Spoke with pt's daughter wendy. We will try to obtain records from [facility] to see when his last colonoscopy was in order to see if he can have his follow up EGD and colon at the same time per patient's request and per Dr. Merideth Abbey.

## 2021-10-19 ENCOUNTER — Ambulatory Visit: Payer: Medicaid Other | Admitting: Gastroenterology

## 2021-10-27 ENCOUNTER — Encounter: Payer: Medicaid Other | Admitting: Gastroenterology

## 2021-11-16 ENCOUNTER — Encounter: Payer: Medicaid Other | Admitting: Gastroenterology

## 2021-11-18 ENCOUNTER — Other Ambulatory Visit: Payer: Self-pay

## 2021-11-18 ENCOUNTER — Encounter: Payer: Self-pay | Admitting: Gastroenterology

## 2021-11-18 ENCOUNTER — Ambulatory Visit (AMBULATORY_SURGERY_CENTER): Payer: Medicaid Other | Admitting: Gastroenterology

## 2021-11-18 VITALS — BP 192/99 | HR 75 | Temp 97.4°F | Resp 12 | Ht 65.0 in | Wt 169.0 lb

## 2021-11-18 DIAGNOSIS — K2100 Gastro-esophageal reflux disease with esophagitis, without bleeding: Secondary | ICD-10-CM

## 2021-11-18 DIAGNOSIS — K219 Gastro-esophageal reflux disease without esophagitis: Secondary | ICD-10-CM

## 2021-11-18 DIAGNOSIS — K449 Diaphragmatic hernia without obstruction or gangrene: Secondary | ICD-10-CM

## 2021-11-18 MED ORDER — SODIUM CHLORIDE 0.9 % IV SOLN: 500.0000 mL | Freq: Once | INTRAVENOUS | Status: DC

## 2021-11-18 MED ORDER — OMEPRAZOLE 40 MG PO CPDR: 40.0000 mg | DELAYED_RELEASE_CAPSULE | Freq: Every day | ORAL | 0 refills | Status: DC

## 2021-11-18 MED ORDER — OMEPRAZOLE 40 MG PO CPDR
40.0000 mg | DELAYED_RELEASE_CAPSULE | Freq: Every day | ORAL | 0 refills | Status: DC
Start: 2021-11-18 — End: 2022-02-13

## 2021-11-18 NOTE — Patient Instructions (Addendum)
Take your medication as directed.  Read all of the handouts given to you by your recovery room nurse.  YOU HAD AN ENDOSCOPIC PROCEDURE TODAY AT THE [facility]:   Refer to the procedure report that was given to you for any specific questions about what was found during the examination.  If the procedure report does not answer your questions, please call your gastroenterologist to clarify.  If you requested that your care partner not be given the details of your procedure findings, then the procedure report has been included in a sealed envelope for you to review at your convenience later.  YOU SHOULD EXPECT: Some feelings of bloating in the abdomen. Passage of more gas than usual.  Walking can help get rid of the air that was put into your GI tract during the procedure and reduce the bloating.   Please Note:  You might notice some irritation and congestion in your nose or some drainage.  This is from the oxygen used during your procedure.  There is no need for concern and it should clear up in a day or so.  SYMPTOMS TO REPORT IMMEDIATELY:  Following upper endoscopy (EGD)  Vomiting of blood or coffee ground material  New chest pain or pain under the shoulder blades  Painful or persistently difficult swallowing  New shortness of breath  Fever of 100F or higher  Black, tarry-looking stools  For urgent or emergent issues, a gastroenterologist can be reached at any hour by calling [phone]. Do not use MyChart messaging for urgent concerns.    DIET:  We do recommend a small meal at first, but then you may proceed to your regular diet.  Drink plenty of fluids but you should avoid alcoholic beverages for 24 hours.  ACTIVITY:  You should plan to take it easy for the rest of today and you should NOT DRIVE or use heavy machinery until tomorrow (because of the sedation medicines used during the test).    FOLLOW UP: Our staff will call the number listed on your records 48-72 hours  following your procedure to check on you and address any questions or concerns that you may have regarding the information given to you following your procedure. If we do not reach you, we will leave a message.  We will attempt to reach you two times.  During this call, we will ask if you have developed any symptoms of COVID 19. If you develop any symptoms (ie: fever, flu-like symptoms, shortness of breath, cough etc.) before then, please call [phone].  If you test positive for Covid 19 in the 2 weeks post procedure, please call and report this information to [place].     SIGNATURES/CONFIDENTIALITY: You and/or your care partner have signed paperwork which will be entered into your electronic medical record.  These signatures attest to the fact that that the information above on your After Visit Summary has been reviewed and is understood.  Full responsibility of the confidentiality of this discharge information lies with you and/or your care-partner.

## 2021-11-18 NOTE — Progress Notes (Signed)
Pt in recovery with monitors in place, VSS. Report given to receiving RN. Bite guard was placed with pt awake to ensure comfort. No dental or soft tissue damage noted. 

## 2021-11-18 NOTE — Progress Notes (Signed)
[facility] History and Physical   Primary Care Physician:  Barbarann Ehlers   Reason for Procedure:   Follow up reflux esophagitis  Plan:    EGD     HPI: Keith Long is a 60 y.o. male undergoing repeat EGD to assess healing of LA Grade B reflux esophagitis seen on Sept 16th.  He has been taking omeprazole with improvement in his GERD symptoms, although he still has symptoms when he eats certain foods.   Past Medical History:  Diagnosis Date   Allergy    Anemia    Arthritis    Asthma    Dr. Sherene Sires   GERD (gastroesophageal reflux disease)    Hemorrhoids    Hyperlipidemia    Rectal bleeding    Right inguinal hernia 2016   Dr. Sheliah Hatch    Past Surgical History:  Procedure Laterality Date   COLONOSCOPY     HEMORRHOID SURGERY     local injection   NOSE SURGERY  11/20/1990    Prior to Admission medications   Medication Sig Start Date End Date Taking? Authorizing Provider  albuterol (VENTOLIN HFA) 108 (90 Base) MCG/ACT inhaler Inhale 2 puffs into the lungs every 6 (six) hours as needed for wheezing or shortness of breath. 10/19/17  Yes Paz, Nolon Rod, MD  fluticasone [facility]) 50 MCG/ACT nasal spray Place 2 sprays into both nostrils daily. 10/19/17  Yes Paz, Nolon Rod, MD  hydrocortisone (ANUSOL-HC) 2.5 % rectal cream Apply topically. 06/20/21  Yes [provider]  omeprazole (PRILOSEC) 10 MG capsule Take by mouth.   Yes [provider]  ADVAIR HFA 115-21 MCG/ACT inhaler Inhale 2 puffs into the lungs 2 (two) times daily. 07/29/21   [provider]  Cyanocobalamin (VITAMIN B-12 PO) Take by mouth. Patient not taking: Reported on 11/18/2021    [provider]  IRON PO Take by mouth. Patient not taking: Reported on 11/18/2021    [provider]  levocetirizine (XYZAL) 5 MG tablet Take 1 tablet (5 mg total) by mouth every evening. Patient not taking: Reported on 11/18/2021 01/28/18   Sharlene Dory, DO  loratadine  (CLARITIN) 10 MG tablet Take by mouth. Patient not taking: Reported on 11/18/2021    [provider]  montelukast (SINGULAIR) 10 MG tablet Take 1 tablet (10 mg total) by mouth at bedtime. Patient not taking: Reported on 11/18/2021 10/19/17   Wanda Plump, MD  RABEprazole (ACIPHEX) 20 MG tablet Take 1 tablet (20 mg total) by mouth 2 (two) times daily. Patient not taking: Reported on 11/18/2021 08/01/21   Jenel Lucks, MD    Current Outpatient Medications  Medication Sig Dispense Refill   albuterol (VENTOLIN HFA) 108 (90 Base) MCG/ACT inhaler Inhale 2 puffs into the lungs every 6 (six) hours as needed for wheezing or shortness of breath. 18 g 5   fluticasone (FLONASE) 50 MCG/ACT nasal spray Place 2 sprays into both nostrils daily. 16 g 6   hydrocortisone (ANUSOL-HC) 2.5 % rectal cream Apply topically.     omeprazole (PRILOSEC) 10 MG capsule Take by mouth.     ADVAIR HFA 115-21 MCG/ACT inhaler Inhale 2 puffs into the lungs 2 (two) times daily.     Cyanocobalamin (VITAMIN B-12 PO) Take by mouth. (Patient not taking: Reported on 11/18/2021)     IRON PO Take by mouth. (Patient not taking: Reported on 11/18/2021)     levocetirizine (XYZAL) 5 MG tablet Take 1 tablet (5 mg total) by mouth every evening. (Patient not  taking: Reported on 11/18/2021) 30 tablet 5   loratadine (CLARITIN) 10 MG tablet Take by mouth. (Patient not taking: Reported on 11/18/2021)     montelukast (SINGULAIR) 10 MG tablet Take 1 tablet (10 mg total) by mouth at bedtime. (Patient not taking: Reported on 11/18/2021) 30 tablet 6   RABEprazole (ACIPHEX) 20 MG tablet Take 1 tablet (20 mg total) by mouth 2 (two) times daily. (Patient not taking: Reported on 11/18/2021) 60 tablet 3   Current Facility-Administered Medications  Medication Dose Route Frequency Provider Last Rate Last Admin   0.9 %  sodium chloride infusion  500 mL Intravenous Once Jenel Lucks, MD        Allergies as of 11/18/2021 - Review Complete  11/18/2021  Allergen Reaction Noted   Augmentin [amoxicillin-pot clavulanate] Nausea Only 08/09/2015   Methylprednisolone  03/22/2018    Family History  Problem Relation Age of Onset   Diabetes Mother    Stroke Father    Colon cancer Neg Hx    Prostate cancer Neg Hx    Esophageal cancer Neg Hx    Rectal cancer Neg Hx    Stomach cancer Neg Hx     Social History   Socioeconomic History   Marital status: Married    Spouse name: Not on file   Number of children: 3   Years of education: Not on file   Highest education level: Not on file  Occupational History   Occupation: Barrister's clerk Patent examiner)  Tobacco Use   Smoking status: Never   Smokeless tobacco: Never  Vaping Use   Vaping Use: Not on file  Substance and Sexual Activity   Alcohol use: Yes    Comment: occasional   Drug use: No   Sexual activity: Not on file  Other Topics Concern   Not on file  Social History Narrative   [place]   Divorced, lives w/ g-friend    3 daughters    Social Determinants of Health   Financial Resource Strain: Not on file  Food Insecurity: Not on file  Transportation Needs: Not on file  Physical Activity: Not on file  Stress: Not on file  Social Connections: Not on file  Intimate Partner Violence: Not on file    Review of Systems:  All other review of systems negative except as mentioned in the HPI.  Physical Exam: Vital signs BP (!) 176/104    Pulse 77    Temp (!) 97.4 F (36.3 C) (Temporal)    Ht 5\' 5"  (1.651 m)    Wt 169 lb (76.7 kg)    SpO2 98%    BMI 28.12 kg/m   General:   Alert,  Well-developed, well-nourished, pleasant and cooperative in NAD Airway:  Mallampati 2 Lungs:  Clear throughout to auscultation.   Heart:  Regular rate and rhythm; no murmurs, clicks, rubs,  or gallops. Abdomen:  Soft, nontender and nondistended. Normal bowel sounds.   Neuro/Psych:  Normal mood and affect. A and O x 3   Darcelle Herrada E. , MD [facility]  Gastroenterology

## 2021-11-18 NOTE — Op Note (Signed)
[facility] Patient Name: Keith Long Procedure Date: 11/18/2021 8:28 AM MRN: [id number] Endoscopist: Lorin Picket E. Tomasa Rand , MD Age: 60 Referring MD:  Date of Birth: 1961-04-26 Gender: Male Account #: [id number] Procedure:                Upper GI endoscopy Indications:              Follow-up of reflux esophagitis Medicines:                Monitored Anesthesia Care Procedure:                Pre-Anesthesia Assessment:                           - Prior to the procedure, a History and Physical                            was performed, and patient medications and                            allergies were reviewed. The patient's tolerance of                            previous anesthesia was also reviewed. The risks                            and benefits of the procedure and the sedation                            options and risks were discussed with the patient.                            All questions were answered, and informed consent                            was obtained. Prior Anticoagulants: The patient has                            taken no previous anticoagulant or antiplatelet                            agents. ASA Grade Assessment: II - A patient with                            mild systemic disease. After reviewing the risks                            and benefits, the patient was deemed in                            satisfactory condition to undergo the procedure.                           After obtaining informed consent, the endoscope was  passed under direct vision. Throughout the                            procedure, the patient's blood pressure, pulse, and                            oxygen saturations were monitored continuously. The                            GIF [id number] #[id number] was introduced through the                            mouth, and advanced to the third part of duodenum.                            The upper GI endoscopy  was accomplished without                            difficulty. The patient tolerated the procedure                            well. Scope In: Scope Out: Findings:                 The examined portions of the nasopharynx,                            oropharynx and larynx were normal.                           Patchy mild erythema was found in the lower third                            of the esophagus.                           The Z-line was irregular with an island of salmon                            colored mucosa.                           The exam of the esophagus was otherwise normal.                           A 3 cm hiatal hernia was present.                           The exam of the stomach was otherwise normal.                           The examined duodenum was normal. Complications:            No immediate complications. Estimated Blood Loss:     Estimated blood loss: none. Impression:               -  The examined portions of the nasopharynx,                            oropharynx and larynx were normal.                           - Erythema in the lower third of the esophagus.                           - Z-line irregular with a single island of salmon                            colored mucosa.                           - 3 cm hiatal hernia.                           - Normal examined duodenum.                           - No specimens collected. Recommendation:           - Patient has a contact number available for                            emergencies. The signs and symptoms of potential                            delayed complications were discussed with the                            patient. Return to normal activities tomorrow.                            Written discharge instructions were provided to the                            patient.                           - Resume previous diet.                           - Continue present medications. Increase omeprazole                             to 40 mg PO daily for 4 weeks                           - Continue to limit foods known to exacerbate GERD                            (spicy foods, tomato-based foods/sauces, coffee,  alcohol, citrus) Lilu Mcglown E. Tomasa Rand, MD 11/18/2021 8:52:50 AM This report has been signed electronically.

## 2021-11-23 ENCOUNTER — Telehealth: Payer: Self-pay

## 2021-11-23 NOTE — Telephone Encounter (Signed)
°  Follow up Call-  Call back number 11/18/2021 08/05/2021  Post procedure Call Back phone  # [phone]  Permission to leave phone message Yes Yes  Some recent data might be hidden     Patient questions:  Do you have a fever, pain , or abdominal swelling? No. Pain Score  0 *  Have you tolerated food without any problems? Yes.    Have you been able to return to your normal activities? Yes.    Do you have any questions about your discharge instructions: Diet   No. Medications  No. Follow up visit  No.  Do you have questions or concerns about your Care? No.  Actions: * If pain score is 4 or above: No action needed, pain <4.

## 2022-02-13 ENCOUNTER — Other Ambulatory Visit: Payer: Self-pay | Admitting: Gastroenterology

## 2022-11-08 ENCOUNTER — Ambulatory Visit
Admission: RE | Admit: 2022-11-08 | Discharge: 2022-11-08 | Disposition: A | Discharge status: Home / Self Care | Payer: Medicaid Other | Source: Ambulatory Visit | Attending: Allergy | Admitting: Allergy

## 2022-11-08 ENCOUNTER — Other Ambulatory Visit: Payer: Self-pay | Admitting: Allergy

## 2022-11-08 DIAGNOSIS — J4540 Moderate persistent asthma, uncomplicated: Secondary | ICD-10-CM

## 2023-01-22 ENCOUNTER — Telehealth: Payer: Self-pay | Admitting: Gastroenterology

## 2023-01-22 NOTE — Telephone Encounter (Signed)
Inbound call from patients daughter Abigail Butts, stating that he had informed her that he was having rectal bleeding. Abigail Butts stated that he should be due for his colonoscopy soon. Abigail Butts is requesting a call back to discuss if patient can be seen sooner than April either with Dr. Candis Schatz or an APP to discuss the rectal bleeding and colonoscopy. Please advise.

## 2023-01-23 NOTE — Telephone Encounter (Signed)
Spoke with pts daughter and scheduled pt to see Carl Best NP 02/05/23 at 1:30pm. Daughter aware of appt.

## 2023-02-05 ENCOUNTER — Ambulatory Visit: Payer: Medicaid Other | Admitting: Nurse Practitioner

## 2023-02-05 ENCOUNTER — Encounter: Payer: Self-pay | Admitting: Nurse Practitioner

## 2023-02-05 ENCOUNTER — Other Ambulatory Visit (INDEPENDENT_AMBULATORY_CARE_PROVIDER_SITE_OTHER): Payer: Medicaid Other

## 2023-02-05 VITALS — BP 136/80 | HR 82 | Ht 65.0 in | Wt 169.0 lb

## 2023-02-05 DIAGNOSIS — K625 Hemorrhage of anus and rectum: Secondary | ICD-10-CM

## 2023-02-05 DIAGNOSIS — K219 Gastro-esophageal reflux disease without esophagitis: Secondary | ICD-10-CM

## 2023-02-05 DIAGNOSIS — D509 Iron deficiency anemia, unspecified: Secondary | ICD-10-CM

## 2023-02-05 LAB — CBC WITH DIFFERENTIAL/PLATELET
Basophils Absolute: 0.1 10*3/uL (ref 0.0–0.1)
Basophils Relative: 0.7 % (ref 0.0–3.0)
Eosinophils Absolute: 0.4 10*3/uL (ref 0.0–0.7)
Eosinophils Relative: 5.6 % — ABNORMAL HIGH (ref 0.0–5.0)
HCT: 35.3 % — ABNORMAL LOW (ref 39.0–52.0)
Hemoglobin: 11.2 g/dL — ABNORMAL LOW (ref 13.0–17.0)
Lymphocytes Relative: 40.9 % (ref 12.0–46.0)
Lymphs Abs: 3.3 10*3/uL (ref 0.7–4.0)
MCHC: 31.7 g/dL (ref 30.0–36.0)
MCV: 76.5 fl — ABNORMAL LOW (ref 78.0–100.0)
Monocytes Absolute: 0.7 10*3/uL (ref 0.1–1.0)
Monocytes Relative: 8.2 % (ref 3.0–12.0)
Neutro Abs: 3.5 10*3/uL (ref 1.4–7.7)
Neutrophils Relative %: 44.6 % (ref 43.0–77.0)
Platelets: 357 10*3/uL (ref 150.0–400.0)
RBC: 4.61 Mil/uL (ref 4.22–5.81)
RDW: 17.4 % — ABNORMAL HIGH (ref 11.5–15.5)
WBC: 8 10*3/uL (ref 4.0–10.5)

## 2023-02-05 LAB — COMPREHENSIVE METABOLIC PANEL
ALT: 20 U/L (ref 0–53)
AST: 17 U/L (ref 0–37)
Albumin: 4.2 g/dL (ref 3.5–5.2)
Alkaline Phosphatase: 94 U/L (ref 39–117)
BUN: 15 mg/dL (ref 6–23)
CO2: 26 mEq/L (ref 19–32)
Calcium: 9.9 mg/dL (ref 8.4–10.5)
Chloride: 105 mEq/L (ref 96–112)
Creatinine, Ser: 0.83 mg/dL (ref 0.40–1.50)
GFR: 95.97 mL/min (ref >60.00)
Glucose, Bld: 77 mg/dL (ref 70–99)
Potassium: 3.8 mEq/L (ref 3.5–5.1)
Sodium: 139 mEq/L (ref 135–145)
Total Bilirubin: 0.3 mg/dL (ref 0.2–1.2)
Total Protein: 8 g/dL (ref 6.0–8.3)

## 2023-02-05 LAB — IBC + FERRITIN
Ferritin: 4.9 ng/mL — ABNORMAL LOW (ref 22.0–322.0)
Iron: 26 ug/dL — ABNORMAL LOW (ref 42–165)
Saturation Ratios: 5.2 % — ABNORMAL LOW (ref 20.0–50.0)
TIBC: 495.6 ug/dL — ABNORMAL HIGH (ref 250.0–450.0)
Transferrin: 354 mg/dL (ref 212.0–360.0)

## 2023-02-05 MED ORDER — OMEPRAZOLE 20 MG PO CPDR: 20.0000 mg | DELAYED_RELEASE_CAPSULE | Freq: Two times a day (BID) | ORAL | 2 refills | Status: DC

## 2023-02-05 MED ORDER — NA SULFATE-K SULFATE-MG SULF 17.5-3.13-1.6 GM/177ML PO SOLN: 1.0000 | Freq: Once | ORAL | 0 refills | Status: AC

## 2023-02-05 NOTE — Patient Instructions (Addendum)
Le han programado una endoscopia y Ardelia Mems colonoscopia. Siga las instrucciones escritas que se le dieron en su visita de hoy. Recoja sus suministros de preparacin en la farmacia dentro de los prximos 1 a 3 das. Si [place] inhaladores (incluso solo cuando sea necesario), trigalos con usted el da de su procedimiento.  Su proveedor le ha solicitado que vaya al stano para Optometrist anlisis de laboratorio antes de irse hoy. Presione "B" en el ascensor. El laboratorio est ubicado en la primera puerta a la izquierda al salir del Materials engineer.  Deje de tomar hierro oral 1 semana antes de su procedimiento.  Preparation H suppositories- 1 per rectum every night for 5 nights (over the counter)  Due to recent changes in healthcare laws, you may see the results of your imaging and laboratory studies on MyChart before your provider has had a chance to review them.  We understand that in some cases there may be results that are confusing or concerning to you. Not all laboratory results come back in the same time frame and the provider may be waiting for multiple results in order to interpret others.  Please give [place] 48 hours in order for your provider to thoroughly review all the results before contacting the office for clarification of your results.    Thank you for trusting me with your gastrointestinal care!   Carl Best, CRNP

## 2023-02-05 NOTE — Progress Notes (Signed)
02/05/2023 Keith Long [id number] [birth year]   Chief Complaint: Rectal bleeding  History of Present Illness: Keith Long is a 62 year old male with a past medical history of asthma, arthritis, GERD and hemorrhoids. S/P umbilical hernia repair [id number]. He is known by Dr. Candis Schatz.  He presents today for further evaluation regarding rectal bleeding. He has a history of recurrent rectal bleeding which he attributes to having hemorrhoids.  He describes seeing a small amount of bright red blood on the toilet tissue or stool twice weekly for the past 6 months.  However, he passed a bowel movement with a large amount of bright red blood approximately 2 weeks ago.  He passes several formed bowel movements daily.  He denies straining.  He has rectal discomfort described as a spasm which can last all day and last occurred yesterday while walking.  No rectal pain at this time.  No abdominal pain.  He reported undergoing treatment for his hemorrhoids in the past by [facility], further details are unclear.  He reported his last colonoscopy by [facility] was 10 years ago which showed internal hemorrhoids, no polyps.  No known family history of colorectal cancer.  He takes Omeprazole 20 mg every morning controls his reflux symptoms during the day but he often has heartburn later in the evening or at night.  No dysphagia.  No NSAID use.  He underwent an EGD 08/05/2021 which showed grade B reflux esophagitis and a 2 cm hiatal hernia.  He is treated with PPI twice daily and a repeat EGD to assess for esophagitis healing was done 11/18/2021 which showed mild erythema in the lower third of the esophagus and a 3 cm hiatal hernia.  Biopsies were not obtained.  He reported taking Ferrous Sulfate 325 mg once daily for the past several months as prescribed by his PCP due to having low iron levels.  In review of laboratory studies from [facility] laboratory studies   Labs 12/16/2021: Iron level of 43. Iron  saturation 12%. Ferritin 27.   Labs 02/22/2022:  Iron level 104.  Iron saturation 30%.  Ferritin 37.   Labs 05/31/2022 Iron level 91. Iron saturation 4%. Ferritin 21.  Hg 13.3.  Hematocrit 39.1.  MCV 85.8. Labs 08/31/2022: Iron 63.  Iron saturation 16%.  Ferritin 18.      Latest Ref Rng & Units 04/24/2017    7:08 AM 10/06/2015    4:11 PM 05/11/2015    4:39 PM  CBC  WBC 4.0 - 10.5 K/uL 5.6  6.8  7.4   Hemoglobin 13.0 - 17.0 g/dL 12.7  13.0  13.4   Hematocrit 39.0 - 52.0 % 39.0  39.2  40.5   Platelets 150.0 - 400.0 K/uL 277.0  299.0  292.0     EGD 11/18/2021: - The examined portions of the nasopharynx, oropharynx and larynx were normal.  - Erythema in the lower third of the esophagus.  - Z-line irregular with a single island of salmon colored mucosa.  - 3 cm hiatal hernia.  - Normal examined duodenum.  - No specimens collected.  EGD 08/05/2021: - The examined portions of the nasopharynx, oropharynx and larynx were normal.  - Z-line irregular.  - LA Grade B reflux esophagitis with no bleeding.  - 2 cm hiatal hernia.  - Normal examined duodenum.  - No specimens collected.  Current Outpatient Medications on File Prior to Visit  Medication Sig Dispense Refill   ADVAIR HFA 115-21 MCG/ACT inhaler Inhale 2 puffs into the  lungs 2 (two) times daily.     Azelastine HCl 137 MCG/SPRAY SOLN TWO SPRAYS BY BOTH NOSTRILS ROUTE 2 (TWO) TIMES DAILY. Nasal     Cyanocobalamin (VITAMIN B-12 PO) Take by mouth.     diphenhydramine-acetaminophen (TYLENOL PM) 25-500 MG TABS tablet Take 1 tablet by mouth at bedtime as needed. Azelastine hydrochloride     fluticasone (FLONASE) 50 MCG/ACT nasal spray Place 2 sprays into both nostrils daily. 16 g 6   hydrocortisone (ANUSOL-HC) 2.5 % rectal cream Apply topically.     IRON PO Take by mouth.     montelukast (SINGULAIR) 10 MG tablet Take 1 tablet (10 mg total) by mouth at bedtime. 30 tablet 6   omeprazole (PRILOSEC) 40 MG capsule TAKE ONE CAPSULE BY MOUTH DAILY 90  capsule 1   albuterol (VENTOLIN HFA) 108 (90 Base) MCG/ACT inhaler Inhale 2 puffs into the lungs every 6 (six) hours as needed for wheezing or shortness of breath. (Patient not taking: Reported on 02/05/2023) 18 g 5   levocetirizine (XYZAL) 5 MG tablet Take 1 tablet (5 mg total) by mouth every evening. (Patient not taking: Reported on 11/18/2021) 30 tablet 5   loratadine (CLARITIN) 10 MG tablet Take by mouth. (Patient not taking: Reported on 11/18/2021)     No current facility-administered medications on file prior to visit.   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  See ophthalmology note from 02/11/2018   Current Medications, Allergies, Past Medical History, Past Surgical History, Family History and Social History were reviewed in [employer] record.  Review of Systems:   Constitutional: Negative for fever, sweats, chills or weight loss.  Respiratory: Negative for shortness of breath.   Cardiovascular: Negative for chest pain, palpitations and leg swelling.  Gastrointestinal: See HPI.  Musculoskeletal: Negative for back pain or muscle aches.  Neurological: Negative for dizziness, headaches or paresthesias.   Physical Exam: BP 136/80   Pulse 82   Ht 5\' 5"  (1.651 m)   Wt 169 lb (76.7 kg)   BMI 28.12 kg/m   General: 62 year old male in no acute distress. Head: Normocephalic and atraumatic. Eyes: No scleral icterus. Conjunctiva pink . Ears: Normal auditory acuity. Mouth: Dentition intact. No ulcers or lesions.  Lungs: Clear throughout to auscultation. Heart: Regular rate and rhythm, no murmur. Abdomen: Soft, nontender and nondistended. No masses or hepatomegaly. Normal bowel sounds x 4 quadrants.  Rectal: Edematous and friable appearing right external  hemorrhoid without thrombosis.  Internal hemorrhoids without prolapse.  No blood or stool in the rectal vault. Shell CMA present during exam.  Musculoskeletal: Symmetrical with no gross deformities. Extremities: No edema. Neurological: Alert oriented x 4. No focal deficits.  Psychological: Alert and cooperative. Normal mood and affect  Assessment and Recommendations:  62 year old male with a history of GERD on Omeprazole 20 mg daily with breakthrough symptoms later in the evening/night for at least the past year. EGD 08/05/2021 showed grade B reflux esophagitis and a 2 cm hiatal hernia. Treated with PPI bid and a repeat EGD 11/18/2021 showed mild erythema in the lower third of the esophagus and a 3 cm hiatal hernia.  Biopsies were not obtained.  -Increase Omeprazole 20 mg twice daily to be taken 30 minutes before breakfast and dinner, if no improvement in 2 weeks will increase Omeprazole  to 40 mg p.o. twice daily -GERD diet reviewed, handout given to the patient -EGD to rule out Barrett's esophagus benefits and risks discussed including risk with sedation, risk of bleeding, perforation and infection.  I will consult with Dr. Candis Schatz to other verified if a repeat EGD warranted at this juncture.  Rectal bleeding, history of internal and external hemorrhoids.  Inflamed friable nonbleeding right external hemorrhoid on internal hemorrhoids without prolapse or active bleeding on exam today.  Patient reported last colonoscopy was 10 years ago. -Apply a small amount of Desitin inside the anal opening and to the external anal area three times daily as needed for anal or hemorrhoidal irritation/bleeding.  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  On Ferrous Sulfate 325 mg once daily. -CBC, CMP, IBC + ferritin  -EGD and colonoscopy as ordered above

## 2023-02-06 NOTE — Addendum Note (Signed)
Addended by: Annabell Howells on: 02/06/2023 09:02 AM   Modules accepted: Orders

## 2023-02-06 NOTE — Progress Notes (Signed)
Agree with the assessment and plan as outlined by Carl Best, NP.  Ok with repeat EGD given frequent symptoms and history of reflux esophagitis in the past.   Shadi Larner E. Candis Schatz, MD [facility]astroenterology

## 2023-02-06 NOTE — Progress Notes (Signed)
Contacted pt's daughter and pt's daughter is aware and understanding of lab results & is also aware for him to return to our lab in 1 week.

## 2023-02-09 ENCOUNTER — Other Ambulatory Visit: Payer: Self-pay

## 2023-02-09 DIAGNOSIS — D509 Iron deficiency anemia, unspecified: Secondary | ICD-10-CM

## 2023-02-12 ENCOUNTER — Other Ambulatory Visit (INDEPENDENT_AMBULATORY_CARE_PROVIDER_SITE_OTHER): Payer: Medicaid Other

## 2023-02-12 DIAGNOSIS — K219 Gastro-esophageal reflux disease without esophagitis: Secondary | ICD-10-CM

## 2023-02-12 DIAGNOSIS — D509 Iron deficiency anemia, unspecified: Secondary | ICD-10-CM | POA: Diagnosis not present

## 2023-02-12 DIAGNOSIS — K625 Hemorrhage of anus and rectum: Secondary | ICD-10-CM

## 2023-02-12 LAB — CBC WITH DIFFERENTIAL/PLATELET
Basophils Absolute: 0.1 10*3/uL (ref 0.0–0.1)
Basophils Relative: 0.8 % (ref 0.0–3.0)
Eosinophils Absolute: 0.5 10*3/uL (ref 0.0–0.7)
Eosinophils Relative: 6.8 % — ABNORMAL HIGH (ref 0.0–5.0)
HCT: 34.8 % — ABNORMAL LOW (ref 39.0–52.0)
Hemoglobin: 11.1 g/dL — ABNORMAL LOW (ref 13.0–17.0)
Lymphocytes Relative: 35.4 % (ref 12.0–46.0)
Lymphs Abs: 2.4 10*3/uL (ref 0.7–4.0)
MCHC: 32 g/dL (ref 30.0–36.0)
MCV: 76.3 fl — ABNORMAL LOW (ref 78.0–100.0)
Monocytes Absolute: 0.7 10*3/uL (ref 0.1–1.0)
Monocytes Relative: 9.8 % (ref 3.0–12.0)
Neutro Abs: 3.2 10*3/uL (ref 1.4–7.7)
Neutrophils Relative %: 47.2 % (ref 43.0–77.0)
Platelets: 321 10*3/uL (ref 150.0–400.0)
RBC: 4.56 Mil/uL (ref 4.22–5.81)
RDW: 17.4 % — ABNORMAL HIGH (ref 11.5–15.5)
WBC: 6.9 10*3/uL (ref 4.0–10.5)

## 2023-02-13 NOTE — Telephone Encounter (Signed)
Inbound call from patient daughter. States patient has an iron infusion coming up as well as a procedure. Patient daughter is inquiring if there is a scheduling conflict. Please advise.  Thank you

## 2023-02-13 NOTE — Telephone Encounter (Signed)
Spoke with pts daughter, she wanted to know if ok for pt to have IV iron prior to procedure. Discussed with her that should be fine, the oral Iron is what we have the pts hold for procedures. She verbalized understanding.

## 2023-02-19 ENCOUNTER — Other Ambulatory Visit: Payer: Self-pay

## 2023-02-19 DIAGNOSIS — D509 Iron deficiency anemia, unspecified: Secondary | ICD-10-CM

## 2023-03-05 ENCOUNTER — Other Ambulatory Visit: Payer: Self-pay

## 2023-03-05 ENCOUNTER — Inpatient Hospital Stay: Payer: Medicaid Other | Attending: Hematology and Oncology | Admitting: Hematology and Oncology

## 2023-03-05 ENCOUNTER — Inpatient Hospital Stay: Payer: Medicaid Other

## 2023-03-05 ENCOUNTER — Encounter: Payer: Self-pay | Admitting: Hematology and Oncology

## 2023-03-05 ENCOUNTER — Telehealth: Payer: Self-pay

## 2023-03-05 VITALS — BP 164/82 | HR 73 | Temp 97.4°F | Resp 18 | Ht 65.0 in | Wt 169.6 lb

## 2023-03-05 DIAGNOSIS — J342 Deviated nasal septum: Secondary | ICD-10-CM

## 2023-03-05 DIAGNOSIS — J45909 Unspecified asthma, uncomplicated: Secondary | ICD-10-CM

## 2023-03-05 DIAGNOSIS — K5900 Constipation, unspecified: Secondary | ICD-10-CM | POA: Diagnosis not present

## 2023-03-05 DIAGNOSIS — D500 Iron deficiency anemia secondary to blood loss (chronic): Secondary | ICD-10-CM | POA: Insufficient documentation

## 2023-03-05 DIAGNOSIS — K909 Intestinal malabsorption, unspecified: Secondary | ICD-10-CM | POA: Insufficient documentation

## 2023-03-05 DIAGNOSIS — Z7951 Long term (current) use of inhaled steroids: Secondary | ICD-10-CM

## 2023-03-05 DIAGNOSIS — J309 Allergic rhinitis, unspecified: Secondary | ICD-10-CM

## 2023-03-05 DIAGNOSIS — Z79899 Other long term (current) drug therapy: Secondary | ICD-10-CM | POA: Diagnosis not present

## 2023-03-05 NOTE — Telephone Encounter (Signed)
-----   Message from Artis Delay, MD sent at 03/05/2023 11:06 AM EDT ----- Pls send ENT consult to Dr. Jenne Pane

## 2023-03-05 NOTE — Telephone Encounter (Signed)
Faxed referral to Dr. Jenne Pane office at [phone], received fax confirmation. Called referral coordinator at Dr. Jenne Pane office and left a message referral sent.

## 2023-03-05 NOTE — Assessment & Plan Note (Signed)
He has significant nasal congestive symptoms Previously, he was evaluated by ENT in 2016 The patient has significant nasal septum deviation It is causing significant difficulties with breathing and recurrent asthma attack Recommend repeat ENT consult

## 2023-03-05 NOTE — Assessment & Plan Note (Signed)
The most likely cause of his anemia is due to chronic blood loss/malabsorption syndrome. We discussed some of the risks, benefits, and alternatives of intravenous iron infusions. The patient is symptomatic from anemia and the iron level is critically low. He tolerated oral iron supplement poorly and desires to achieved higher levels of iron faster for adequate hematopoesis. Some of the side-effects to be expected including risks of infusion reactions, phlebitis, headaches, nausea and fatigue.  The patient is willing to proceed. Patient education material was dispensed.  Goal is to keep ferritin level greater than 50 and resolution of anemia The patient has failed oral iron treatment He is also constipated He is instructed to stop oral iron supplement After 2 doses of IV iron Feraheme, I plan to see him back in June for further follow-up with repeat blood work

## 2023-03-05 NOTE — Progress Notes (Signed)
[facility] CONSULT NOTE  Patient Care Team: Barbarann Ehlers as PCP - General (Physician Assistant) Nyoka Cowden, MD as Consulting Physician (Pulmonary Disease) Darnell Level, MD as Consulting Physician (General Surgery) Kinsinger, De Blanch, MD as Consulting Physician (General Surgery)  ASSESSMENT & PLAN:  Iron deficiency anemia due to chronic blood loss The most likely cause of his anemia is due to chronic blood loss/malabsorption syndrome. We discussed some of the risks, benefits, and alternatives of intravenous iron infusions. The patient is symptomatic from anemia and the iron level is critically low. He tolerated oral iron supplement poorly and desires to achieved higher levels of iron faster for adequate hematopoesis. Some of the side-effects to be expected including risks of infusion reactions, phlebitis, headaches, nausea and fatigue.  The patient is willing to proceed. Patient education material was dispensed.  Goal is to keep ferritin level greater than 50 and resolution of anemia The patient has failed oral iron treatment He is also constipated He is instructed to stop oral iron supplement After 2 doses of IV iron Feraheme, I plan to see him back in June for further follow-up with repeat blood work  Chronic allergic rhinitis He has significant nasal congestive symptoms Previously, he was evaluated by ENT in 2016 The patient has significant nasal septum deviation It is causing significant difficulties with breathing and recurrent asthma attack Recommend repeat ENT consult Orders Placed This Encounter  Procedures   CBC with Differential (Cancer Center Only)    Standing Status:   Future    Standing Expiration Date:   03/04/2024   Iron and Iron Binding Capacity ([facility] only)    Standing Status:   Future    Standing Expiration Date:   03/04/2024   Ferritin    Standing Status:   Future    Standing Expiration Date:   03/04/2024   Ambulatory referral to  ENT    Referral Priority:   Routine    Referral Type:   Consultation    Referral Reason:   Specialty Services Required    Referred to Provider:   Christia Reading, MD    Requested Specialty:   Otolaryngology    Number of Visits Requested:   1    All questions were answered. The patient knows to call the clinic with any problems, questions or concerns.  The total time spent in the appointment was 60 minutes encounter with patients including review of chart and various tests results, discussions about plan of care and coordination of care plan  Artis Delay, MD 4/15/202411:02 AM   CHIEF COMPLAINTS/PURPOSE OF CONSULTATION:  Anemia  HISTORY OF PRESENTING ILLNESS:  Keith Long 62 y.o. male is here because of anemia  He was found to have abnormal CBC from recent blood work I have the opportunity to review his CBC dated back more than 10 years He is noted to be anemic since 2018 intermittently Most recently, he had microcytic anemia with confirmed severe iron deficiency  He denies recent chest pain on exertion, shortness of breath on minimal exertion, pre-syncopal episodes, or palpitations. He complains of fatigue and intermittent leg cramps The patient had chronic gastritis as well as intermittent rectal bleeding He is scheduled for colonoscopy evaluation next week Last EGD evaluation was in December 2022 He has intermittent rectal bleeding several times a month He felt that he is occasionally passing hard stool  He had not noticed any recent bleeding such as epistaxis or hematuria  The patient denies over the counter NSAID  ingestion. He is not on antiplatelets agents.  He had no prior history or diagnosis of cancer. His age appropriate screening programs are up-to-date. He denies any pica and eats a variety of diet. He never donated blood or received blood transfusion The patient was prescribed oral iron supplements and he takes to daily in the morning for the past year without  benefit  MEDICAL HISTORY:  Past Medical History:  Diagnosis Date   Allergy    Anemia    Arthritis    Asthma    Dr. Sherene Sires   GERD (gastroesophageal reflux disease)    Hemorrhoids    Hyperlipidemia    Rectal bleeding    Right inguinal hernia 2016   Dr. Sheliah Hatch    SURGICAL HISTORY: Past Surgical History:  Procedure Laterality Date   COLONOSCOPY     HEMORRHOID SURGERY     local injection   NOSE SURGERY  11/20/1990    SOCIAL HISTORY: Social History   Socioeconomic History   Marital status: Married    Spouse name: Not on file   Number of children: 3   Years of education: Not on file   Highest education level: Not on file  Occupational History   Occupation: Barrister's clerk Patent examiner)  Tobacco Use   Smoking status: Never   Smokeless tobacco: Never  Vaping Use   Vaping Use: Not on file  Substance and Sexual Activity   Alcohol use: Yes    Comment: occasional   Drug use: No   Sexual activity: Not on file  Other Topics Concern   Not on file  Social History Narrative   [place]   Divorced, lives w/ g-friend    3 daughters    Social Determinants of Health   Financial Resource Strain: Not on file  Food Insecurity: Not on file  Transportation Needs: Not on file  Physical Activity: Not on file  Stress: Not on file  Social Connections: Not on file  Intimate Partner Violence: Not on file    FAMILY HISTORY: Family History  Problem Relation Age of Onset   Diabetes Mother    Stroke Father    Colon cancer Neg Hx    Prostate cancer Neg Hx    Esophageal cancer Neg Hx    Rectal cancer Neg Hx    Stomach cancer Neg Hx     ALLERGIES:  is allergic to augmentin [amoxicillin-pot clavulanate] and methylprednisolone.  MEDICATIONS:  Current Outpatient Medications  Medication Sig Dispense Refill   ADVAIR HFA 115-21 MCG/ACT inhaler Inhale 2 puffs into the lungs 2 (two) times daily.     albuterol (VENTOLIN HFA) 108 (90 Base) MCG/ACT inhaler Inhale 2 puffs  into the lungs every 6 (six) hours as needed for wheezing or shortness of breath. (Patient not taking: Reported on 02/05/2023) 18 g 5   Azelastine HCl 137 MCG/SPRAY SOLN TWO SPRAYS BY BOTH NOSTRILS ROUTE 2 (TWO) TIMES DAILY. Nasal     Cyanocobalamin (VITAMIN B-12 PO) Take by mouth.     diphenhydramine-acetaminophen (TYLENOL PM) 25-500 MG TABS tablet Take 1 tablet by mouth at bedtime as needed. Azelastine hydrochloride     hydrocortisone (ANUSOL-HC) 2.5 % rectal cream Apply topically.     loratadine (CLARITIN) 10 MG tablet Take by mouth. (Patient not taking: Reported on 11/18/2021)     montelukast (SINGULAIR) 10 MG tablet Take 1 tablet (10 mg total) by mouth at bedtime. 30 tablet 6   omeprazole (PRILOSEC) 20 MG capsule Take 1 capsule (20 mg total) by mouth  2 (two) times daily. Take 30 minutes before breakfast and dinner (Patient not taking: Reported on 02/05/2023) 60 capsule 2   No current facility-administered medications for this visit.    REVIEW OF SYSTEMS: He has chronic sinus congestion and history of recurrent asthma attack.  He has chronic postnasal drip Constitutional: Denies fevers, chills or abnormal night sweats Eyes: Denies blurriness of vision, double vision or watery eyes Ears, nose, mouth, throat, and face: Denies mucositis or sore throat Cardiovascular: Denies palpitation, chest discomfort or lower extremity swelling Gastrointestinal:  Denies nausea, heartburn or change in bowel habits Skin: Denies abnormal skin rashes Lymphatics: Denies new lymphadenopathy or easy bruising Neurological:Denies numbness, tingling or new weaknesses Behavioral/Psych: Mood is stable, no new changes  All other systems were reviewed with the patient and are negative.  PHYSICAL EXAMINATION: ECOG PERFORMANCE STATUS: 1 - Symptomatic but completely ambulatory  Vitals:   03/05/23 1006  BP: (!) 164/82  Pulse: 73  Resp: 18  Temp: (!) 97.4 F (36.3 C)  SpO2: 100%   Filed Weights   03/05/23 1006   Weight: 169 lb 9.6 oz (76.9 kg)    GENERAL:alert, no distress and comfortable SKIN: skin color, texture, turgor are normal, no rashes or significant lesions EYES: normal, conjunctiva are pink and non-injected, sclera clear OROPHARYNX:no exudate, no erythema and lips, buccal mucosa, and tongue normal  NECK: supple, thyroid normal size, non-tender, without nodularity LYMPH:  no palpable lymphadenopathy in the cervical, axillary or inguinal LUNGS: He has scattered bilateral wheezes HEART: regular rate & rhythm and no murmurs and no lower extremity edema ABDOMEN:abdomen soft, non-tender and normal bowel sounds Musculoskeletal:no cyanosis of digits and no clubbing  PSYCH: alert & oriented x 3 with fluent speech NEURO: no focal motor/sensory deficits

## 2023-03-14 ENCOUNTER — Encounter: Payer: Self-pay | Admitting: Gastroenterology

## 2023-03-14 ENCOUNTER — Ambulatory Visit (AMBULATORY_SURGERY_CENTER): Payer: Medicaid Other | Admitting: Gastroenterology

## 2023-03-14 VITALS — BP 132/63 | HR 79 | Temp 98.3°F | Resp 10 | Ht 65.0 in | Wt 169.0 lb

## 2023-03-14 DIAGNOSIS — K3189 Other diseases of stomach and duodenum: Secondary | ICD-10-CM | POA: Diagnosis not present

## 2023-03-14 DIAGNOSIS — K625 Hemorrhage of anus and rectum: Secondary | ICD-10-CM | POA: Diagnosis not present

## 2023-03-14 DIAGNOSIS — D509 Iron deficiency anemia, unspecified: Secondary | ICD-10-CM | POA: Diagnosis not present

## 2023-03-14 DIAGNOSIS — K2950 Unspecified chronic gastritis without bleeding: Secondary | ICD-10-CM

## 2023-03-14 DIAGNOSIS — K219 Gastro-esophageal reflux disease without esophagitis: Secondary | ICD-10-CM

## 2023-03-14 DIAGNOSIS — K649 Unspecified hemorrhoids: Secondary | ICD-10-CM

## 2023-03-14 MED ORDER — SODIUM CHLORIDE 0.9 % IV SOLN
500.0000 mL | Freq: Once | INTRAVENOUS | Status: DC
Start: 2023-03-14 — End: 2023-03-14

## 2023-03-14 NOTE — Op Note (Signed)
[facility] Patient Name: Keith Long Procedure Date: 03/14/2023 2:13 PM MRN: [id number] Endoscopist: Lorin Picket E. Tomasa Rand , MD, [id number] Age: 62 Referring MD:  Date of Birth: 30-Jun-1961 Gender: Male Account #: [id number] Procedure:                Upper GI endoscopy Indications:              Iron deficiency anemia, Follow-up of reflux                            esophagitis Medicines:                Monitored Anesthesia Care Procedure:                Pre-Anesthesia Assessment:                           - Prior to the procedure, a History and Physical                            was performed, and patient medications and                            allergies were reviewed. The patient's tolerance of                            previous anesthesia was also reviewed. The risks                            and benefits of the procedure and the sedation                            options and risks were discussed with the patient.                            All questions were answered, and informed consent                            was obtained. Prior Anticoagulants: The patient has                            taken no anticoagulant or antiplatelet agents. ASA                            Grade Assessment: II - A patient with mild systemic                            disease. After reviewing the risks and benefits,                            the patient was deemed in satisfactory condition to                            undergo the procedure.  After obtaining informed consent, the endoscope was                            passed under direct vision. Throughout the                            procedure, the patient's blood pressure, pulse, and                            oxygen saturations were monitored continuously. The                            Olympus Scope [id number] was introduced through the                            mouth, and advanced to the second part of  duodenum.                            The upper GI endoscopy was accomplished without                            difficulty. The patient tolerated the procedure                            well. Scope In: Scope Out: Findings:                 The Z-line was irregular.                           The exam of the esophagus was otherwise normal.                           A 3 cm hiatal hernia was present.                           Localized irregular mucosa was found on the lesser                            curvature of the stomach. Biopsies were taken with                            a cold forceps for histology. Estimated blood loss                            was minimal.                           The exam of the stomach was otherwise normal.                           Biopsies were taken with a cold forceps in the                            gastric antrum for Helicobacter pylori testing.  Estimated blood loss was minimal.                           The examined duodenum was normal. Biopsies for                            histology were taken with a cold forceps for                            evaluation of celiac disease. Estimated blood loss                            was minimal. Complications:            No immediate complications. Estimated Blood Loss:     Estimated blood loss was minimal. Impression:               - Z-line irregular.                           - 3 cm hiatal hernia.                           - Localized irregular gastric mucosa. Biopsied to                            exclude dysplasia.                           - Normal examined duodenum. Biopsied.                           - Biopsies were taken with a cold forceps for                            Helicobacter pylori testing. Recommendation:           - Patient has a contact number available for                            emergencies. The signs and symptoms of potential                             delayed complications were discussed with the                            patient. Return to normal activities tomorrow.                            Written discharge instructions were provided to the                            patient.                           - Resume previous diet.                           -  Continue present medications.                           - Await pathology results.                           - No endoscopic abnormalities to explain iron                            deficiency anemia.                           - Proceed with colonoscopy.                           - Continue acid suppressive therapy given history                            of reflux esophagitis. [name] E. Tomasa Rand, MD 03/14/2023 3:02:17 PM This report has been signed electronically.

## 2023-03-14 NOTE — Patient Instructions (Addendum)
Handouts Provided:  Diverticulosis, High fiber diet and hemorrhoid banding.   Continue acid suppressive therapy given history of reflux esophagitis  Repeat colonoscopy in 10 years for screening purposes.  Recommend daily fiber supplement/high fiber diet to reduce hemorrhoidal bleeding.  Hemorrhoid band ligation could also be considered.   YOU HAD AN ENDOSCOPIC PROCEDURE TODAY AT THE [facility]:   Refer to the procedure report that was given to you for any specific questions about what was found during the examination.  If the procedure report does not answer your questions, please call your gastroenterologist to clarify.  If you requested that your care partner not be given the details of your procedure findings, then the procedure report has been included in a sealed envelope for you to review at your convenience later.  YOU SHOULD EXPECT: Some feelings of bloating in the abdomen. Passage of more gas than usual.  Walking can help get rid of the air that was put into your GI tract during the procedure and reduce the bloating. If you had a lower endoscopy (such as a colonoscopy or flexible sigmoidoscopy) you may notice spotting of blood in your stool or on the toilet paper. If you underwent a bowel prep for your procedure, you may not have a normal bowel movement for a few days.  Please Note:  You might notice some irritation and congestion in your nose or some drainage.  This is from the oxygen used during your procedure.  There is no need for concern and it should clear up in a day or so.  SYMPTOMS TO REPORT IMMEDIATELY:  Following lower endoscopy (colonoscopy or flexible sigmoidoscopy):  Excessive amounts of blood in the stool  Significant tenderness or worsening of abdominal pains  Swelling of the abdomen that is new, acute  Fever of 100F or higher  Following upper endoscopy (EGD)  Vomiting of blood or coffee ground material  New chest pain or pain under the shoulder  blades  Painful or persistently difficult swallowing  New shortness of breath  Fever of 100F or higher  Black, tarry-looking stools  For urgent or emergent issues, a gastroenterologist can be reached at any hour by calling [phone]. Do not use MyChart messaging for urgent concerns.    DIET:  We do recommend a small meal at first, but then you may proceed to your regular diet.  Drink plenty of fluids but you should avoid alcoholic beverages for 24 hours.  ACTIVITY:  You should plan to take it easy for the rest of today and you should NOT DRIVE or use heavy machinery until tomorrow (because of the sedation medicines used during the test).    FOLLOW UP: Our staff will call the number listed on your records the next business day following your procedure.  We will call around 7:15- 8:00 am to check on you and address any questions or concerns that you may have regarding the information given to you following your procedure. If we do not reach you, we will leave a message.     If any biopsies were taken you will be contacted by phone or by letter within the next 1-3 weeks.  Please call us at [phone] if you have not heard about the biopsies in 3 weeks.    SIGNATURES/CONFIDENTIALITY: You and/or your care partner have signed paperwork which will be entered into your electronic medical record.  These signatures attest to the fact that that the information above on your After Visit Summary has been  reviewed and is understood.  Full responsibility of the confidentiality of this discharge information lies with you and/or your care-partner.

## 2023-03-14 NOTE — Progress Notes (Signed)
Gastroenterology History and Physical   Primary Care Physician:  Barbarann Ehlers   Reason for Procedure:   Iron deficiency anemia, rectal bleeding, GERD symptoms  Plan:    EGD, colonoscopy     HPI: Keith Long is a 62 y.o. male undergoing EGD and colonoscopy.  He has a history of LA Grade B esophagitis which healed with PPI.  He has been having intermittent rectal bleeding and was noted to have iron deficiency anemia which is new. He had a colonoscopy about 10 years ago.  Past Medical History:  Diagnosis Date   Allergy    Anemia    Arthritis    Asthma    Dr. Sherene Sires   GERD (gastroesophageal reflux disease)    Hemorrhoids    Hyperlipidemia    Rectal bleeding    Right inguinal hernia 2016   Dr. Sheliah Hatch    Past Surgical History:  Procedure Laterality Date   COLONOSCOPY     HEMORRHOID SURGERY     local injection   NOSE SURGERY  11/20/1990   [facility]      Prior to Admission medications   Medication Sig Start Date End Date Taking? Authorizing Provider  ADVAIR HFA 115-21 MCG/ACT inhaler Inhale 2 puffs into the lungs 2 (two) times daily. 07/29/21  Yes [provider]  atorvastatin (LIPITOR) 10 MG tablet Take by mouth. 05/31/22  Yes [provider]  Azelastine HCl 137 MCG/SPRAY SOLN TWO SPRAYS BY BOTH NOSTRILS ROUTE 2 (TWO) TIMES DAILY. Nasal 10/16/22  Yes [provider]  Cyanocobalamin (VITAMIN B-12 PO) Take by mouth.   Yes [provider]  loratadine (CLARITIN) 10 MG tablet Take by mouth.   Yes [provider]  omeprazole (PRILOSEC) 20 MG capsule Take 1 capsule (20 mg total) by mouth 2 (two) times daily. Take 30 minutes before breakfast and dinner 02/05/23  Yes Kennedy-Smith, Malachi Carl, NP  albuterol (VENTOLIN HFA) 108 (90 Base) MCG/ACT inhaler Inhale 2 puffs into the lungs every 6 (six) hours as needed for wheezing or shortness of breath. Patient not taking: Reported on 02/05/2023 10/19/17    Wanda Plump, MD  cetirizine (ZYRTEC) 10 MG tablet 1 tablet Orally Once a day for 30 days 11/08/22   [provider]  diphenhydramine-acetaminophen (TYLENOL PM) 25-500 MG TABS tablet Take 1 tablet by mouth at bedtime as needed. Azelastine hydrochloride    [provider]  hydrocortisone (ANUSOL-HC) 2.5 % rectal cream Apply topically. 06/20/21   [provider]  montelukast (SINGULAIR) 10 MG tablet Take 1 tablet (10 mg total) by mouth at bedtime. 10/19/17   Wanda Plump, MD    Current Outpatient Medications  Medication Sig Dispense Refill   ADVAIR [facility] 115-21 MCG/ACT inhaler Inhale 2 puffs into the lungs 2 (two) times daily.     atorvastatin (LIPITOR) 10 MG tablet Take by mouth.     Azelastine HCl 137 MCG/SPRAY SOLN TWO SPRAYS BY BOTH NOSTRILS ROUTE 2 (TWO) TIMES DAILY. Nasal     Cyanocobalamin (VITAMIN B-12 PO) Take by mouth.     loratadine (CLARITIN) 10 MG tablet Take by mouth.     omeprazole (PRILOSEC) 20 MG capsule Take 1 capsule (20 mg total) by mouth 2 (two) times daily. Take 30 minutes before breakfast and dinner 60 capsule 2   albuterol (VENTOLIN HFA) 108 (90 Base) MCG/ACT inhaler Inhale 2 puffs into the lungs every 6 (six) hours as needed for wheezing or shortness of breath. (Patient not taking: Reported on  02/05/2023) 18 g 5   cetirizine (ZYRTEC) 10 MG tablet 1 tablet Orally Once a day for 30 days     diphenhydramine-acetaminophen (TYLENOL PM) 25-500 MG TABS tablet Take 1 tablet by mouth at bedtime as needed. Azelastine hydrochloride     hydrocortisone (ANUSOL-HC) 2.5 % rectal cream Apply topically.     montelukast (SINGULAIR) 10 MG tablet Take 1 tablet (10 mg total) by mouth at bedtime. 30 tablet 6   Current Facility-Administered Medications  Medication Dose Route Frequency Provider Last Rate Last Admin   0.9 %  sodium chloride infusion  500 mL Intravenous Once Jenel Lucks, MD        Allergies as of 03/14/2023 - Review Complete 03/14/2023  Allergen  Reaction Noted   Augmentin [amoxicillin-pot clavulanate] Nausea Only 08/09/2015   Methylprednisolone  03/22/2018    Family History  Problem Relation Age of Onset   Diabetes Mother    Stroke Father    Colon cancer Neg Hx    Prostate cancer Neg Hx    Esophageal cancer Neg Hx    Rectal cancer Neg Hx    Stomach cancer Neg Hx     Social History   Socioeconomic History   Marital status: Married    Spouse name: Not on file   Number of children: 3   Years of education: Not on file   Highest education level: Not on file  Occupational History   Occupation: Barrister's clerk Patent examiner)  Tobacco Use   Smoking status: Never   Smokeless tobacco: Never  Vaping Use   Vaping Use: Never used  Substance and Sexual Activity   Alcohol use: Yes    Comment: occasional   Drug use: No   Sexual activity: Not on file  Other Topics Concern   Not on file  Social History Narrative   Original from [place]   Divorced, lives w/ g-friend    3 daughters    Social Determinants of Health   Financial Resource Strain: Not on file  Food Insecurity: Not on file  Transportation Needs: Not on file  Physical Activity: Not on file  Stress: Not on file  Social Connections: Not on file  Intimate Partner Violence: Not on file    Review of Systems:  All other review of systems negative except as mentioned in the HPI.  Physical Exam: Vital signs BP (!) 161/86   Pulse 73   Temp 98.3 F (36.8 C) (Temporal)   Resp 10   Ht  (1.651 m)   Wt 169 lb (76.7 kg)   SpO2 98%   BMI 28.12 kg/m   General:   Alert,  Well-developed, well-nourished, pleasant and cooperative in NAD Airway:  Mallampati 2 Lungs:  Clear throughout to auscultation.   Heart:  Regular rate and rhythm; no murmurs, clicks, rubs,  or gallops. Abdomen:  Soft, nontender and nondistended. Normal bowel sounds.   Neuro/Psych:  Normal mood and affect. A and O x 3   Trayce Caravello E. Tomasa Rand, MD [facility]

## 2023-03-14 NOTE — Progress Notes (Signed)
Report to PACU, RN, vss, BBS= Clear.  

## 2023-03-14 NOTE — Op Note (Signed)
[facility] Patient Name: Keith Long Procedure Date: 03/14/2023 2:12 PM MRN: [id number] Endoscopist: Lorin Picket E. Tomasa Rand , MD, [id number] Age: 62 Referring MD:  Date of Birth: [birth year] Gender: Male Account #: [id number] Procedure:                Colonoscopy Indications:              Rectal bleeding, Iron deficiency anemia Medicines:                Monitored Anesthesia Care Procedure:                Pre-Anesthesia Assessment:                           - Prior to the procedure, a History and Physical                            was performed, and patient medications and                            allergies were reviewed. The patient's tolerance of                            previous anesthesia was also reviewed. The risks                            and benefits of the procedure and the sedation                            options and risks were discussed with the patient.                            All questions were answered, and informed consent                            was obtained. Prior Anticoagulants: The patient has                            taken no anticoagulant or antiplatelet agents. ASA                            Grade Assessment: II - A patient with mild systemic                            disease. After reviewing the risks and benefits,                            the patient was deemed in satisfactory condition to                            undergo the procedure.                           After obtaining informed consent, the colonoscope  was passed under direct vision. Throughout the                            procedure, the patient's blood pressure, pulse, and                            oxygen saturations were monitored continuously. The                            CF [id number] #[id number] was introduced through the anus                            and advanced to the the terminal ileum, with                            identification of  the appendiceal orifice and IC                            valve. The colonoscopy was performed without                            difficulty. The patient tolerated the procedure                            well. The quality of the bowel preparation was                            good. The terminal ileum, ileocecal valve,                            appendiceal orifice, and rectum were photographed.                            The bowel preparation used was SUPREP via split                            dose instruction. Scope In: 2:41:40 PM Scope Out: 2:54:56 PM Scope Withdrawal Time: 0 hours 9 minutes 38 seconds  Total Procedure Duration: 0 hours 13 minutes 16 seconds  Findings:                 Hemorrhoids were found on perianal exam.                           Multiple small-mouthed diverticula were found in                            the sigmoid colon, descending colon, transverse                            colon and ascending colon.                           The exam was otherwise normal throughout the  examined colon.                           The terminal ileum appeared normal.                           Non-bleeding internal hemorrhoids were found during                            retroflexion. The hemorrhoids were large and Grade                            I (internal hemorrhoids that do not prolapse).                           No additional abnormalities were found on                            retroflexion. Complications:            No immediate complications. Estimated Blood Loss:     Estimated blood loss: none. Impression:               - Hemorrhoids found on perianal exam.                           - Diverticulosis in the sigmoid colon, in the                            descending colon, in the transverse colon and in                            the ascending colon.                           - The examined portion of the ileum was normal.                            - Non-bleeding internal hemorrhoids. This is the                            source of the patient's rectal and is a possible                            source of iron deficiency.                           - No specimens collected. Recommendation:           - Patient has a contact number available for                            emergencies. The signs and symptoms of potential                            delayed complications were discussed with the  patient. Return to normal activities tomorrow.                            Written discharge instructions were provided to the                            patient.                           - Resume previous diet.                           - Continue present medications.                           - Repeat colonoscopy in 10 years for screening                            purposes.                           - Recommend daily fiber supplement/high fiber diet                            to reduce hemorrhoidal bleeding. Hemorrhoid band                            ligation could also be considered. Prabhav Faulkenberry E. Tomasa Rand, MD 03/14/2023 3:06:40 PM This report has been signed electronically.

## 2023-03-14 NOTE — Progress Notes (Signed)
Called to room to assist during endoscopic procedure.  Patient ID and intended procedure confirmed with present staff. Received instructions for my participation in the procedure from the performing physician.  

## 2023-03-14 NOTE — Progress Notes (Signed)
VS completed by CW   Pt's states no medical or surgical changes since previsit or office visit.  

## 2023-03-15 ENCOUNTER — Telehealth: Payer: Self-pay | Admitting: *Deleted

## 2023-03-15 NOTE — Telephone Encounter (Signed)
No answer for post procedure call back. Left VM. 

## 2023-03-19 ENCOUNTER — Other Ambulatory Visit: Payer: Self-pay

## 2023-03-19 ENCOUNTER — Inpatient Hospital Stay: Payer: Medicaid Other

## 2023-03-19 VITALS — BP 159/91 | HR 66 | Temp 98.1°F | Resp 18

## 2023-03-19 DIAGNOSIS — D500 Iron deficiency anemia secondary to blood loss (chronic): Secondary | ICD-10-CM

## 2023-03-19 MED ORDER — SODIUM CHLORIDE 0.9 % IV SOLN: Freq: Once | INTRAVENOUS | Status: AC

## 2023-03-19 MED ORDER — SODIUM CHLORIDE 0.9 % IV SOLN
510.0000 mg | Freq: Once | INTRAVENOUS | Status: AC
  Administered 2023-03-19: 510 mg via INTRAVENOUS
  Filled 2023-03-19: qty 510

## 2023-03-19 NOTE — Progress Notes (Signed)
Pt stayed for 30 minute post observation following infusion. Pt d/c to lobby in stable condition with no complaints. See VS in flowsheets.

## 2023-03-22 ENCOUNTER — Encounter: Payer: Self-pay | Admitting: Gastroenterology

## 2023-03-23 ENCOUNTER — Telehealth: Payer: Self-pay

## 2023-03-23 NOTE — Telephone Encounter (Signed)
Called to check on referral sent on 4/15. They have the referral and will call him today to schedule appt.

## 2023-03-23 NOTE — Telephone Encounter (Signed)
-----   Message from Artis Delay, MD sent at 03/23/2023  8:53 AM EDT ----- I think I placed ENT consult not long ago Did they set up appt?

## 2023-03-26 ENCOUNTER — Inpatient Hospital Stay: Payer: Medicaid Other | Attending: Hematology and Oncology

## 2023-03-26 VITALS — BP 144/84 | HR 74 | Temp 97.7°F | Resp 16

## 2023-03-26 DIAGNOSIS — D500 Iron deficiency anemia secondary to blood loss (chronic): Secondary | ICD-10-CM | POA: Diagnosis present

## 2023-03-26 MED ORDER — SODIUM CHLORIDE 0.9 % IV SOLN: Freq: Once | INTRAVENOUS | Status: AC

## 2023-03-26 MED ORDER — SODIUM CHLORIDE 0.9 % IV SOLN
510.0000 mg | Freq: Once | INTRAVENOUS | Status: AC
  Administered 2023-03-26: 510 mg via INTRAVENOUS
  Filled 2023-03-26: qty 510

## 2023-03-26 NOTE — Patient Instructions (Addendum)
Ferumoxytol Injection Qu es este medicamento? El FERUMOXITOL trata los niveles bajos de hierro en el cuerpo (anemia por deficiencia de hierro). El hierro es un mineral que cumple una funcin importante en la produccin de glbulos rojos, que llevan el oxgeno de los pulmones al resto del cuerpo. Este medicamento puede ser utilizado para otros usos; si tiene alguna pregunta consulte con su proveedor de atencin mdica o con su farmacutico. MARCAS COMUNES: Feraheme Qu le debo informar a mi profesional de la salud antes de tomar este medicamento? Necesitan saber si usted presenta alguno de los siguientes problemas o situaciones: Anemia no causada por niveles bajos de hierro Niveles altos de hierro en la sangre Tiene programada la realizacin de una resonancia magntica Una reaccin alrgica o inusual al hierro, a otros medicamentos, alimentos, colorantes o conservantes Si est embarazada o buscando quedar embarazada Si est amamantando a un beb Cmo debo utilizar este medicamento? Este medicamento se inyecta en una vena. Su equipo de atencin lo administra en un hospital o en un entorno clnico. Hable con su equipo de atencin sobre el uso de este medicamento en nios. Puede requerir atencin especial. Sobredosis: Pngase en contacto inmediatamente con un centro toxicolgico o una sala de urgencia si usted cree que haya tomado demasiado medicamento. ATENCIN: Este medicamento es solo para usted. No comparta este medicamento con nadie. Qu sucede si me olvido de una dosis? Es importante no olvidar ninguna dosis. Llame a su equipo de atencin si no puede asistir a una cita. Qu puede interactuar con este medicamento? Otros productos con hierro Puede ser que esta lista no menciona todas las posibles interacciones. Informe a su profesional de la salud de todos los productos a base de hierbas, medicamentos de venta libre o suplementos nutritivos que est tomando. Si usted fuma, consume bebidas  alcohlicas o si utiliza drogas ilegales, indqueselo tambin a su profesional de la salud. Algunas sustancias pueden interactuar con su medicamento. A qu debo estar atento al usar este medicamento? Visite peridicamente a su equipo de atencin. Si los sntomas no comienzan a mejorar o si empeoran, informe a su equipo de atencin. Usted podra necesitar realizarse anlisis de sangre mientras est usando este medicamento. Es posible que deba seguir una dieta especial. Hable con su equipo de atencin. Los alimentos que contienen hierro incluyen: granos enteros/cereales, frutas secas, legumbres, o guisantes, verduras de hojas verdes y asaduras (hgado, rin). Qu efectos secundarios puedo tener al utilizar este medicamento? Efectos secundarios que debe informar a su equipo de atencin tan pronto como sea posible: Reacciones alrgicas: erupcin cutnea, comezn/picazn, urticaria, hinchazn de la cara, los labios, la lengua o la garganta Presin arterial baja: mareo, sensacin de desmayo o aturdimiento, visin borrosa Falta de aliento Efectos secundarios que generalmente no requieren atencin mdica (debe informarlos a su equipo de atencin si persisten o si son molestos): Enrojecimiento Dolor de cabeza Dolor en las articulaciones Dolor muscular Nuseas Dolor, enrojecimiento o irritacin en el lugar de la inyeccin Puede ser que esta lista no menciona todos los posibles efectos secundarios. Comunquese a su mdico por asesoramiento mdico sobre los efectos secundarios. Usted puede informar los efectos secundarios a la FDA por telfono al 1-800-FDA-1088. Dnde debo guardar mi medicina? Este medicamento se administra en hospitales o clnicas y no es necesario guardarlo en su domicilio. ATENCIN: Este folleto es un resumen. Puede ser que no cubra toda la posible informacin. Si usted tiene preguntas acerca de esta medicina, consulte con su mdico, su farmacutico o su profesional de la salud.     2023 Elsevier/Gold Standard (2022-09-27 00:00:00)  

## 2023-03-26 NOTE — Progress Notes (Signed)
Pt observed for 10 minutes post Feraheme infusion. Pt tolerated trtmt well w/out incident. VSS at discharge.  Ambulatory to lobby.   

## 2023-04-30 ENCOUNTER — Inpatient Hospital Stay: Payer: Medicaid Other | Attending: Hematology and Oncology

## 2023-04-30 ENCOUNTER — Other Ambulatory Visit: Payer: Self-pay

## 2023-04-30 DIAGNOSIS — D500 Iron deficiency anemia secondary to blood loss (chronic): Secondary | ICD-10-CM | POA: Insufficient documentation

## 2023-04-30 DIAGNOSIS — Z79899 Other long term (current) drug therapy: Secondary | ICD-10-CM | POA: Diagnosis not present

## 2023-04-30 DIAGNOSIS — K649 Unspecified hemorrhoids: Secondary | ICD-10-CM | POA: Insufficient documentation

## 2023-04-30 LAB — IRON AND IRON BINDING CAPACITY (CC-WL,HP ONLY)
Iron: 86 ug/dL (ref 45–182)
Saturation Ratios: 29 % (ref 17.9–39.5)
TIBC: 300 ug/dL (ref 250–450)
UIBC: 214 ug/dL (ref 117–376)

## 2023-04-30 LAB — CBC WITH DIFFERENTIAL (CANCER CENTER ONLY)
Abs Immature Granulocytes: 0.01 10*3/uL (ref 0.00–0.07)
Basophils Absolute: 0 10*3/uL (ref 0.0–0.1)
Basophils Relative: 0 %
Eosinophils Absolute: 0.3 10*3/uL (ref 0.0–0.5)
Eosinophils Relative: 4 %
HCT: 40.5 % (ref 39.0–52.0)
Hemoglobin: 13.8 g/dL (ref 13.0–17.0)
Immature Granulocytes: 0 %
Lymphocytes Relative: 39 %
Lymphs Abs: 3 10*3/uL (ref 0.7–4.0)
MCH: 28 pg (ref 26.0–34.0)
MCHC: 34.1 g/dL (ref 30.0–36.0)
MCV: 82.2 fL (ref 80.0–100.0)
Monocytes Absolute: 0.6 10*3/uL (ref 0.1–1.0)
Monocytes Relative: 7 %
Neutro Abs: 3.7 10*3/uL (ref 1.7–7.7)
Neutrophils Relative %: 50 %
Platelet Count: 287 10*3/uL (ref 150–400)
RBC: 4.93 MIL/uL (ref 4.22–5.81)
RDW: 21.2 % — ABNORMAL HIGH (ref 11.5–15.5)
WBC Count: 7.5 10*3/uL (ref 4.0–10.5)
nRBC: 0 % (ref 0.0–0.2)

## 2023-05-01 LAB — FERRITIN: Ferritin: 121 ng/mL (ref 24–336)

## 2023-05-03 ENCOUNTER — Other Ambulatory Visit: Payer: Self-pay | Admitting: Hematology and Oncology

## 2023-05-03 ENCOUNTER — Other Ambulatory Visit: Payer: Self-pay

## 2023-05-03 ENCOUNTER — Inpatient Hospital Stay: Payer: Medicaid Other | Admitting: Hematology and Oncology

## 2023-05-03 ENCOUNTER — Encounter: Payer: Self-pay | Admitting: Hematology and Oncology

## 2023-05-03 VITALS — BP 149/94 | HR 68 | Temp 97.7°F | Resp 18 | Ht 65.0 in | Wt 165.2 lb

## 2023-05-03 DIAGNOSIS — K649 Unspecified hemorrhoids: Secondary | ICD-10-CM

## 2023-05-03 DIAGNOSIS — D500 Iron deficiency anemia secondary to blood loss (chronic): Secondary | ICD-10-CM | POA: Diagnosis not present

## 2023-05-03 NOTE — Assessment & Plan Note (Signed)
He has responded very well to intravenous iron infusion He is no longer anemic and iron studies are normal However, he has ongoing GI blood loss from hemorrhoidal bleeding I recommend return appointment in 4 months for further follow-up and he agrees

## 2023-05-03 NOTE — Progress Notes (Signed)
[facility] OFFICE PROGRESS NOTE  Jordan Hawks, PA-C  ASSESSMENT & PLAN:  Iron deficiency anemia due to chronic blood loss He has responded very well to intravenous iron infusion He is no longer anemic and iron studies are normal However, he has ongoing GI blood loss from hemorrhoidal bleeding I recommend return appointment in 4 months for further follow-up and he agrees  Hemorrhoids This has been a chronic issue He had normal colonoscopy except for confirm hemorrhoids I recommend increase oral fluid intake and regular stool softener   Orders Placed This Encounter  Procedures   Iron and Iron Binding Capacity ([facility] only)    Standing Status:   Future    Standing Expiration Date:   05/02/2024   Ferritin    Standing Status:   Future    Standing Expiration Date:   05/02/2024   CBC with Differential (Cancer Center Only)    Standing Status:   Future    Standing Expiration Date:   05/02/2024    The total time spent in the appointment was 20 minutes encounter with patients including review of chart and various tests results, discussions about plan of care and coordination of care plan   All questions were answered. The patient knows to call the clinic with any problems, questions or concerns. No barriers to learning was detected.    Artis Delay, MD 6/13/202412:10 PM  INTERVAL HISTORY: Keith Long 62 y.o. male returns for further follow-up for iron deficiency anemia due to chronic GI bleed from hemorrhoids He is doing well His energy level is better We reviewed test results He saw ENT physician for chronic nasal drainage  SUMMARY OF HEMATOLOGIC HISTORY:  He was found to have abnormal CBC from recent blood work I have the opportunity to review his CBC dated back more than 10 years He is noted to be anemic since 2018 intermittently Most recently, he had microcytic anemia with confirmed severe iron deficiency  He denies recent chest pain on exertion,  shortness of breath on minimal exertion, pre-syncopal episodes, or palpitations. He complains of fatigue and intermittent leg cramps The patient had chronic gastritis as well as intermittent rectal bleeding He is scheduled for colonoscopy evaluation next week Last EGD evaluation was in December 2022 He has intermittent rectal bleeding several times a month He felt that he is occasionally passing hard stool  He had not noticed any recent bleeding such as epistaxis or hematuria  The patient denies over the counter NSAID ingestion. He is not on antiplatelets agents.  He had no prior history or diagnosis of cancer. His age appropriate screening programs are up-to-date. He denies any pica and eats a variety of diet. He never donated blood or received blood transfusion The patient was prescribed oral iron supplements and he takes to daily in the morning for the past year without benefit He received intravenous iron in April and May with resolution of anemia  I have reviewed the past medical history, past surgical history, social history and family history with the patient and they are unchanged from previous note.  ALLERGIES:  is allergic to augmentin [amoxicillin-pot clavulanate] and methylprednisolone.  MEDICATIONS:  Current Outpatient Medications  Medication Sig Dispense Refill   ADVAIR HFA 115-21 MCG/ACT inhaler Inhale 2 puffs into the lungs 2 (two) times daily.     albuterol (VENTOLIN HFA) 108 (90 Base) MCG/ACT inhaler Inhale 2 puffs into the lungs every 6 (six) hours as needed for wheezing or shortness of breath. (Patient not  taking: Reported on 02/05/2023) 18 g 5   atorvastatin (LIPITOR) 10 MG tablet Take by mouth.     Azelastine HCl 137 MCG/SPRAY SOLN TWO SPRAYS BY BOTH NOSTRILS ROUTE 2 (TWO) TIMES DAILY. Nasal     cetirizine (ZYRTEC) 10 MG tablet 1 tablet Orally Once a day for 30 days     Cyanocobalamin (VITAMIN B-12 PO) Take by mouth.     diphenhydramine-acetaminophen (TYLENOL PM)  25-500 MG TABS tablet Take 1 tablet by mouth at bedtime as needed. Azelastine hydrochloride     hydrocortisone (ANUSOL-HC) 2.5 % rectal cream Apply topically.     loratadine (CLARITIN) 10 MG tablet Take by mouth.     montelukast (SINGULAIR) 10 MG tablet Take 1 tablet (10 mg total) by mouth at bedtime. 30 tablet 6   omeprazole (PRILOSEC) 20 MG capsule Take 1 capsule (20 mg total) by mouth 2 (two) times daily. Take 30 minutes before breakfast and dinner 60 capsule 2   No current facility-administered medications for this visit.     REVIEW OF SYSTEMS:   Constitutional: Denies fevers, chills or night sweats Eyes: Denies blurriness of vision Ears, nose, mouth, throat, and face: Denies mucositis or sore throat Respiratory: Denies cough, dyspnea or wheezes Cardiovascular: Denies palpitation, chest discomfort or lower extremity swelling Gastrointestinal:  Denies nausea, heartburn or change in bowel habits Skin: Denies abnormal skin rashes Lymphatics: Denies new lymphadenopathy or easy bruising Neurological:Denies numbness, tingling or new weaknesses Behavioral/Psych: Mood is stable, no new changes  All other systems were reviewed with the patient and are negative.  PHYSICAL EXAMINATION: ECOG PERFORMANCE STATUS: 0 - Asymptomatic  Vitals:   05/03/23 1201  BP: (!) 149/94  Pulse: 68  Resp: 18  Temp: 97.7 F (36.5 C)  SpO2: 98%   Filed Weights   05/03/23 1201  Weight: 165 lb 3.2 oz (74.9 kg)    GENERAL:alert, no distress and comfortable NEURO: alert & oriented x 3 with fluent speech, no focal motor/sensory deficits  LABORATORY DATA:  I have reviewed the data as listed     Component Value Date/Time   NA 139 02/05/2023 1433   K 3.8 02/05/2023 1433   CL 105 02/05/2023 1433   CO2 26 02/05/2023 1433   GLUCOSE 77 02/05/2023 1433   BUN 15 02/05/2023 1433   CREATININE 0.83 02/05/2023 1433   CALCIUM 9.9 02/05/2023 1433   PROT 8.0 02/05/2023 1433   ALBUMIN 4.2 02/05/2023 1433   AST  17 02/05/2023 1433   ALT 20 02/05/2023 1433   ALKPHOS 94 02/05/2023 1433   BILITOT 0.3 02/05/2023 1433    No results found for: "SPEP", "UPEP"  Lab Results  Component Value Date   WBC 7.5 04/30/2023   NEUTROABS 3.7 04/30/2023   HGB 13.8 04/30/2023   HCT 40.5 04/30/2023   MCV 82.2 04/30/2023   PLT 287 04/30/2023      Chemistry      Component Value Date/Time   NA 139 02/05/2023 1433   K 3.8 02/05/2023 1433   CL 105 02/05/2023 1433   CO2 26 02/05/2023 1433   BUN 15 02/05/2023 1433   CREATININE 0.83 02/05/2023 1433      Component Value Date/Time   CALCIUM 9.9 02/05/2023 1433   ALKPHOS 94 02/05/2023 1433   AST 17 02/05/2023 1433   ALT 20 02/05/2023 1433   BILITOT 0.3 02/05/2023 1433

## 2023-05-03 NOTE — Assessment & Plan Note (Signed)
This has been a chronic issue He had normal colonoscopy except for confirm hemorrhoids I recommend increase oral fluid intake and regular stool softener

## 2023-07-24 ENCOUNTER — Telehealth: Payer: Self-pay | Admitting: Pulmonary Disease

## 2023-07-24 ENCOUNTER — Encounter: Payer: Self-pay | Admitting: Pulmonary Disease

## 2023-07-24 ENCOUNTER — Ambulatory Visit: Payer: Medicaid Other | Admitting: Pulmonary Disease

## 2023-07-24 VITALS — BP 130/78 | HR 75 | Temp 98.9°F | Ht 65.0 in | Wt 166.8 lb

## 2023-07-24 DIAGNOSIS — G4733 Obstructive sleep apnea (adult) (pediatric): Secondary | ICD-10-CM | POA: Diagnosis not present

## 2023-07-24 MED ORDER — AZELASTINE HCL 0.1 % NA SOLN: 2.0000 | Freq: Two times a day (BID) | NASAL | 12 refills | Status: DC

## 2023-07-24 MED ORDER — FLUTICASONE-SALMETEROL 230-21 MCG/ACT IN AERO: 2.0000 | INHALATION_SPRAY | Freq: Two times a day (BID) | RESPIRATORY_TRACT | 12 refills | Status: DC

## 2023-07-24 NOTE — Telephone Encounter (Signed)
Pt daughter calling in returning missed call.

## 2023-07-24 NOTE — Patient Instructions (Signed)
Obtain recent Sleep study result  DME referral for autoCPAP , settings of 5-20 with heated humidifications and patients mask of choice  Increase Advair dose  Follow up in 6-8 weeks  Avoid known asthma triggers

## 2023-07-24 NOTE — Progress Notes (Signed)
Keith Long    [id number]    1961-09-14  Primary Care Physician:Gordon, Gaye Alken, PA-C  Referring Physician: Jordan Hawks, PA-C [address] [place],  [place] [address]  Chief complaint:   Patient being seen for asthma History of obstructive sleep apnea  HPI:  Diagnosed with severe obstructive sleep apnea in January 2024 Was not initiated on CPAP therapy -Did have some insurance issues with respect to approving an in lab titration study  History of asthma over 10 years -Triggers include humid warm weather -Does have nasal stuffiness and congestion  Recently saw ENT  Usually goes to bed between 9 and 10 PM Wakes up at 5 AM 2 awakenings Does have some dryness of his mouth in the morning Has headaches around-the-clock Memory is poor  Admits to wheezing He does have a nebulizer to use as needed -Uses albuterol about once a week or less  Shortness of breath on exertion  Does have nasal stuffiness and congestion for which he is on Astelin at present Did use Flonase but this was giving him some side effects  History of hypertension, shortness of breath with activity wheezing with activity    Outpatient Encounter Medications as of 07/24/2023  Medication Sig   ADVAIR HFA 115-21 MCG/ACT inhaler Inhale 2 puffs into the lungs 2 (two) times daily.   albuterol (VENTOLIN HFA) 108 (90 Base) MCG/ACT inhaler Inhale 2 puffs into the lungs every 6 (six) hours as needed for wheezing or shortness of breath.   atorvastatin (LIPITOR) 10 MG tablet Take by mouth.   azelastine (ASTELIN) 0.1 % nasal spray Place 2 sprays into both nostrils 2 (two) times daily. Use in each nostril as directed   Azelastine HCl 137 MCG/SPRAY SOLN TWO SPRAYS BY BOTH NOSTRILS ROUTE 2 (TWO) TIMES DAILY. Nasal   Cyanocobalamin (VITAMIN B-12 PO) Take by mouth.   diphenhydramine-acetaminophen (TYLENOL PM) 25-500 MG TABS tablet Take 1 tablet by mouth at bedtime as needed. Azelastine  hydrochloride   fluticasone-salmeterol (ADVAIR HFA) 230-21 MCG/ACT inhaler Inhale 2 puffs into the lungs 2 (two) times daily.   hydrocortisone (ANUSOL-HC) 2.5 % rectal cream Apply topically.   loratadine (CLARITIN) 10 MG tablet Take by mouth.   montelukast (SINGULAIR) 10 MG tablet Take 1 tablet (10 mg total) by mouth at bedtime.   omeprazole (PRILOSEC) 20 MG capsule Take 1 capsule (20 mg total) by mouth 2 (two) times daily. Take 30 minutes before breakfast and dinner   [DISCONTINUED] cetirizine (ZYRTEC) 10 MG tablet 1 tablet Orally Once a day for 30 days   No facility-administered encounter medications on file as of 07/24/2023.    Allergies as of 07/24/2023 - Review Complete 07/24/2023  Allergen Reaction Noted   Augmentin [amoxicillin-pot clavulanate] Nausea Only 08/09/2015   Methylprednisolone  03/22/2018    Past Medical History:  Diagnosis Date   Allergy    Anemia    Arthritis    Asthma    Dr. Sherene Sires   GERD (gastroesophageal reflux disease)    Hemorrhoids    Hyperlipidemia    Rectal bleeding    Right inguinal hernia 2016   Dr. Sheliah Hatch    Past Surgical History:  Procedure Laterality Date   COLONOSCOPY     HEMORRHOID SURGERY     local injection   NOSE SURGERY  11/20/1990   UPPER GASTROINTESTINAL ENDOSCOPY      Family History  Problem Relation Age of Onset   Diabetes Mother  Stroke Father    Colon cancer Neg Hx    Prostate cancer Neg Hx    Esophageal cancer Neg Hx    Rectal cancer Neg Hx    Stomach cancer Neg Hx     Social History   Socioeconomic History   Marital status: Married    Spouse name: Not on file   Number of children: 3   Years of education: Not on file   Highest education level: Not on file  Occupational History   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[employer])  Tobacco Use   Smoking status: Never   Smokeless tobacco: Never  Vaping Use   Vaping status: Never Used  Substance and Sexual Activity   Alcohol use: Yes    Comment: occasional   Drug use:  No   Sexual activity: Not on file  Other Topics Concern   Not on file  Social History Narrative   [place] from [place]   Divorced, lives w/ g-friend    3 daughters    Social Determinants of Health   Financial Resource Strain: Low Risk  (12/08/2022)   Received from [facility], [facility]   Overall Financial Resource Strain (CARDIA)    Difficulty of Paying Living Expenses: Not hard at all  Food Insecurity: No Food Insecurity (12/08/2022)   Received from [facility], [facility]   Hunger Vital Sign    Worried About Running Out of Food in the Last Year: Never true    Ran Out of Food in the Last Year: Never true  Transportation Needs: No Transportation Needs (12/08/2022)   Received from [employer], [facility]   PRAPARE - Transportation    Lack of Transportation (Medical): No    Lack of Transportation (Non-Medical): No  Physical Activity: Insufficiently Active (12/08/2022)   Received from [facility], [facility]   Exercise Vital Sign    Days of Exercise per Week: 2 days    Minutes of Exercise per Session: 20 min  Stress: No Stress Concern Present (12/08/2022)   Received from [facility], [facility] of Occupational Health - Occupational Stress Questionnaire    Feeling of Stress : Only a little  Social Connections: Somewhat Isolated (12/08/2022)   Received from [facility], [facility]   Social Network    How would you rate your social network (family, work, friends)?: Restricted participation with some degree of social isolation  Intimate Partner Violence: Not At Risk (12/08/2022)   Received from [facility], [facility]   HITS    Over the last 12 months how often did your partner physically hurt you?: 1    Over the last 12 months how often did your partner insult you or talk down to you?: 1    Over the last 12 months how often did your partner threaten you with physical harm?: 1    Over the last 12 months how often did your  partner scream or curse at you?: 1    Review of Systems  Respiratory:  Positive for apnea, shortness of breath and wheezing.   Psychiatric/Behavioral:  Positive for sleep disturbance.     Vitals:   07/24/23 1100  BP: 130/78  Pulse: 75  Temp: 98.9 F (37.2 C)  SpO2: 97%     Physical Exam Constitutional:      Appearance: Normal appearance.  HENT:     Head: Normocephalic.     Mouth/Throat:     Mouth: Mucous membranes are moist.  Eyes:  General: No scleral icterus.    Pupils: Pupils are equal, round, and reactive to light.  Cardiovascular:     Rate and Rhythm: Normal rate and regular rhythm.     Heart sounds: No murmur heard.    No friction rub.  Pulmonary:     Effort: No respiratory distress.     Breath sounds: No stridor. No wheezing or rhonchi.  Musculoskeletal:     Cervical back: No rigidity or tenderness.  Neurological:     Mental Status: He is alert.  Psychiatric:        Mood and Affect: Mood normal.    Data Reviewed: Sleep study from January 2024 revealed AHI of 54 -Full report not available for me to review at present  Assessment:  History of severe obstructive sleep apnea -Will with request for study result  History of asthma with suboptimal control at present -Plan to increase dose of Advair  Pathophysiology of sleep disordered breathing discussed with the patient Treatment options discussed with patient   Plan/Recommendations: DME referral for auto CPAP 5-20 with heated humidification with patient's mask of choice  Importance of treating sleep disordered breathing was discussed with the patient  The significance of compliance with CPAP therapy was discussed with the patient  Increased dose of Advair  Albuterol to be used as needed  Refill for Astelin sent to pharmacy  Encouraged to call with significant concerns  Follow-up in 6 to 8 weeks  Virl Diamond MD [facility] 07/24/2023, 1:14 PM  CC: Jordan Hawks, PA-C

## 2023-07-26 NOTE — Telephone Encounter (Signed)
Unable to find a message where someone called the patient.

## 2023-09-04 ENCOUNTER — Inpatient Hospital Stay: Payer: Medicaid Other

## 2023-09-04 ENCOUNTER — Inpatient Hospital Stay: Payer: Medicaid Other | Admitting: Hematology and Oncology

## 2023-09-14 ENCOUNTER — Ambulatory Visit: Payer: 59 | Admitting: Pulmonary Disease

## 2023-09-14 ENCOUNTER — Encounter: Payer: Self-pay | Admitting: Pulmonary Disease

## 2023-09-14 VITALS — BP 138/90 | HR 74 | Ht 65.0 in | Wt 166.2 lb

## 2023-09-14 DIAGNOSIS — J329 Chronic sinusitis, unspecified: Secondary | ICD-10-CM | POA: Diagnosis not present

## 2023-09-14 DIAGNOSIS — G4733 Obstructive sleep apnea (adult) (pediatric): Secondary | ICD-10-CM

## 2023-09-14 MED ORDER — DOXYCYCLINE HYCLATE 100 MG PO TABS: 100.0000 mg | ORAL_TABLET | Freq: Two times a day (BID) | ORAL | 0 refills | Status: DC

## 2023-09-14 NOTE — Progress Notes (Signed)
Keith Long    [id number]    1961-04-27  Primary Care Physician:Gordon, Gaye Alken, PA-C  Referring Physician: Jordan Hawks, PA-C [address] [place],  [place] [address]  Chief complaint:   Patient being seen for asthma History of obstructive sleep apnea  HPI:  Having nasal stuffiness congestion, cough, yellow mucus production No fevers or chills  Has not picked up his CPAP machine yet but has an appointment  History of asthma diagnosed about 10 years ago, triggers include changes in weather  Usually goes to bed between 9 and 10 PM Wakes up at 5 AM 2 awakenings Does have some dryness of his mouth in the morning Has headaches around-the-clock Memory is poor  Admits to wheezing He does have a nebulizer to use as needed -Uses albuterol about once a week or less  Shortness of breath on exertion  Does have nasal stuffiness and congestion for which he is on Astelin at present Did use Flonase but this was giving him some side effects  History of hypertension, shortness of breath with activity wheezing with activity  Outpatient Encounter Medications as of 09/14/2023  Medication Sig   ADVAIR HFA 115-21 MCG/ACT inhaler Inhale 2 puffs into the lungs 2 (two) times daily.   albuterol (VENTOLIN HFA) 108 (90 Base) MCG/ACT inhaler Inhale 2 puffs into the lungs every 6 (six) hours as needed for wheezing or shortness of breath.   atorvastatin (LIPITOR) 10 MG tablet Take by mouth.   azelastine (ASTELIN) 0.1 % nasal spray Place 2 sprays into both nostrils 2 (two) times daily. Use in each nostril as directed   Azelastine HCl 137 MCG/SPRAY SOLN TWO SPRAYS BY BOTH NOSTRILS ROUTE 2 (TWO) TIMES DAILY. Nasal   Cyanocobalamin (VITAMIN B-12 PO) Take by mouth.   diphenhydramine-acetaminophen (TYLENOL PM) 25-500 MG TABS tablet Take 1 tablet by mouth at bedtime as needed. Azelastine hydrochloride   fluticasone-salmeterol (ADVAIR HFA) 230-21 MCG/ACT inhaler Inhale 2  puffs into the lungs 2 (two) times daily.   hydrocortisone (ANUSOL-HC) 2.5 % rectal cream Apply topically.   loratadine (CLARITIN) 10 MG tablet Take by mouth.   montelukast (SINGULAIR) 10 MG tablet Take 1 tablet (10 mg total) by mouth at bedtime.   omeprazole (PRILOSEC) 20 MG capsule Take 1 capsule (20 mg total) by mouth 2 (two) times daily. Take 30 minutes before breakfast and dinner   No facility-administered encounter medications on file as of 09/14/2023.    Allergies as of 09/14/2023 - Review Complete 09/14/2023  Allergen Reaction Noted   Augmentin [amoxicillin-pot clavulanate] Nausea Only 08/09/2015   Methylprednisolone  03/22/2018    Past Medical History:  Diagnosis Date   Allergy    Anemia    Arthritis    Asthma    Dr. Sherene Sires   GERD (gastroesophageal reflux disease)    Hemorrhoids    Hyperlipidemia    Rectal bleeding    Right inguinal hernia 2016   Dr. Sheliah Hatch    Past Surgical History:  Procedure Laterality Date   COLONOSCOPY     HEMORRHOID SURGERY     local injection   NOSE SURGERY  11/20/1990   UPPER GASTROINTESTINAL ENDOSCOPY      Family History  Problem Relation Age of Onset   Diabetes Mother    Stroke Father    Colon cancer Neg Hx    Prostate cancer Neg Hx    Esophageal cancer Neg Hx    Rectal cancer Neg Hx  Stomach cancer Neg Hx     Social History   Socioeconomic History   Marital status: Married    Spouse name: Not on file   Number of children: 3   Years of education: Not on file   Highest education level: Not on file  Occupational History   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[employer])  Tobacco Use   Smoking status: Never   Smokeless tobacco: Never  Vaping Use   Vaping status: Never Used  Substance and Sexual Activity   Alcohol use: Yes    Comment: occasional   Drug use: No   Sexual activity: Not on file  Other Topics Concern   Not on file  Social History Narrative   Original from [place]   Divorced, lives w/ g-friend    3  daughters    Social Determinants of Health   Financial Resource Strain: Low Risk  (12/08/2022)   Received from [facility], [facility]   Overall Financial Resource Strain (CARDIA)    Difficulty of Paying Living Expenses: Not hard at all  Food Insecurity: Low Risk  (07/09/2023)   Received from [facility]   Hunger Vital Sign    Worried About Running Out of Food in the Last Year: Never true    Ran Out of Food in the Last Year: Never true  Transportation Needs: No Transportation Needs (07/09/2023)   Received from [facility]    In the past 12 months, has lack of reliable transportation kept you from medical appointments, meetings, work or from getting things needed for daily living? : No  Physical Activity: Insufficiently Active (12/08/2022)   Received from [facility], [facility]   Exercise Vital Sign    Days of Exercise per Week: 2 days    Minutes of Exercise per Session: 20 min  Stress: No Stress Concern Present (12/08/2022)   Received from [facility], [facility]   [employer] of Occupational Health - Occupational Stress Questionnaire    Feeling of Stress : Only a little  Social Connections: Somewhat Isolated (12/08/2022)   Received from [facility], [facility]   Social Network    How would you rate your social network (family, work, friends)?: Restricted participation with some degree of social isolation  Intimate Partner Violence: Not At Risk (12/08/2022)   Received from [facility], [facility]   HITS    Over the last 12 months how often did your partner physically hurt you?: 1    Over the last 12 months how often did your partner insult you or talk down to you?: 1    Over the last 12 months how often did your partner threaten you with physical harm?: 1    Over the last 12 months how often did your partner scream or curse at you?: 1    Review of Systems  Respiratory:  Positive for apnea, cough, shortness of breath and wheezing.    Psychiatric/Behavioral:  Positive for sleep disturbance.     There were no vitals filed for this visit.    Physical Exam Constitutional:      Appearance: Normal appearance.  HENT:     Head: Normocephalic.     Mouth/Throat:     Mouth: Mucous membranes are moist.  Eyes:     General: No scleral icterus.    Pupils: Pupils are equal, round, and reactive to light.  Cardiovascular:     Rate and Rhythm: Normal rate and regular rhythm.     Heart sounds: No  murmur heard.    No friction rub.  Pulmonary:     Effort: No respiratory distress.     Breath sounds: No stridor. No wheezing or rhonchi.  Musculoskeletal:     Cervical back: No rigidity or tenderness.  Neurological:     Mental Status: He is alert.  Psychiatric:        Mood and Affect: Mood normal.    Data Reviewed: Sleep study from January 2024 revealed AHI of 54 -Full report not available for me to review at present  Assessment:  History of severe obstructive sleep apnea -Will with request for study result  Sinusitis -Will call in a course of antibiotics  History of asthma with suboptimal control at present -Tolerating Advair well  Pathophysiology of sleep disordered breathing discussed with the patient Treatment options discussed with patient   Plan/Recommendations: Encouraged to make sure he picks up his CPAP machine  Prescription for doxycycline sent to pharmacy for him  Continue Advair use on a regular basis  Continue Astelin for nasal stuffiness and congestion  Follow-up in 6 months   Virl Diamond MD [facility] 09/14/2023, 9:08 AM  CC: Jordan Hawks, PA-C

## 2023-09-14 NOTE — Patient Instructions (Addendum)
Prescription for doxycycline sent to pharmacy for you  You can request for refills of your medications on MyChart if you are running out  Tentative follow-up in about 6 months  Make sure you follow-up with picking up your CPAP machine and start using it regularly  Call us with significant concerns  Administer flu shot today

## 2023-09-28 ENCOUNTER — Inpatient Hospital Stay (HOSPITAL_BASED_OUTPATIENT_CLINIC_OR_DEPARTMENT_OTHER): Payer: 59 | Admitting: Hematology and Oncology

## 2023-09-28 ENCOUNTER — Inpatient Hospital Stay: Payer: 59 | Attending: Hematology and Oncology

## 2023-09-28 ENCOUNTER — Encounter: Payer: Self-pay | Admitting: Hematology and Oncology

## 2023-09-28 VITALS — BP 146/79 | HR 74 | Temp 97.7°F | Resp 18 | Ht 65.0 in | Wt 164.8 lb

## 2023-09-28 DIAGNOSIS — Z79899 Other long term (current) drug therapy: Secondary | ICD-10-CM | POA: Diagnosis not present

## 2023-09-28 DIAGNOSIS — D500 Iron deficiency anemia secondary to blood loss (chronic): Secondary | ICD-10-CM | POA: Insufficient documentation

## 2023-09-28 DIAGNOSIS — K649 Unspecified hemorrhoids: Secondary | ICD-10-CM | POA: Diagnosis not present

## 2023-09-28 DIAGNOSIS — K648 Other hemorrhoids: Secondary | ICD-10-CM | POA: Diagnosis not present

## 2023-09-28 LAB — CBC WITH DIFFERENTIAL (CANCER CENTER ONLY)
Abs Immature Granulocytes: 0.01 10*3/uL (ref 0.00–0.07)
Basophils Absolute: 0.1 10*3/uL (ref 0.0–0.1)
Basophils Relative: 1 %
Eosinophils Absolute: 0.4 10*3/uL (ref 0.0–0.5)
Eosinophils Relative: 7 %
HCT: 26.8 % — ABNORMAL LOW (ref 39.0–52.0)
Hemoglobin: 8.5 g/dL — ABNORMAL LOW (ref 13.0–17.0)
Immature Granulocytes: 0 %
Lymphocytes Relative: 38 %
Lymphs Abs: 2.2 10*3/uL (ref 0.7–4.0)
MCH: 23.9 pg — ABNORMAL LOW (ref 26.0–34.0)
MCHC: 31.7 g/dL (ref 30.0–36.0)
MCV: 75.5 fL — ABNORMAL LOW (ref 80.0–100.0)
Monocytes Absolute: 0.5 10*3/uL (ref 0.1–1.0)
Monocytes Relative: 9 %
Neutro Abs: 2.5 10*3/uL (ref 1.7–7.7)
Neutrophils Relative %: 45 %
Platelet Count: 420 10*3/uL — ABNORMAL HIGH (ref 150–400)
RBC: 3.55 MIL/uL — ABNORMAL LOW (ref 4.22–5.81)
RDW: 17.9 % — ABNORMAL HIGH (ref 11.5–15.5)
WBC Count: 5.7 10*3/uL (ref 4.0–10.5)
nRBC: 0 % (ref 0.0–0.2)

## 2023-09-28 LAB — IRON AND IRON BINDING CAPACITY (CC-WL,HP ONLY)
Iron: 10 ug/dL — ABNORMAL LOW (ref 45–182)
Saturation Ratios: 2 % — ABNORMAL LOW (ref 17.9–39.5)
TIBC: 466 ug/dL — ABNORMAL HIGH (ref 250–450)
UIBC: 456 ug/dL — ABNORMAL HIGH (ref 117–376)

## 2023-09-28 LAB — FERRITIN: Ferritin: 3 ng/mL — ABNORMAL LOW (ref 24–336)

## 2023-09-28 NOTE — Progress Notes (Signed)
[facility] OFFICE PROGRESS NOTE  Jordan Hawks, PA-C  ASSESSMENT & PLAN:  Iron deficiency anemia due to chronic blood loss He has significant drop of his hemoglobin with recurrent iron deficiency anemia again and reactive thrombocytosis This is due to his chronic hemorrhoidal bleeding I recommend 2 more doses of intravenous iron infusion and GI follow-up to fix his hemorrhoids The most likely cause of his anemia is due to chronic blood loss/malabsorption syndrome. We discussed some of the risks, benefits, and alternatives of intravenous iron infusions. The patient is symptomatic from anemia and the iron level is critically low. He tolerated oral iron supplement poorly and desires to achieved higher levels of iron faster for adequate hematopoesis. Some of the side-effects to be expected including risks of infusion reactions, phlebitis, headaches, nausea and fatigue.  The patient is willing to proceed. Patient education material was dispensed.  Goal is to keep ferritin level greater than 50 and resolution of anemia   Hemorrhoids The patient has daily hemorrhoidal bleeding He is colonoscopy from this year confirm evidence of internal hemorrhoids I will reach out to his gastroenterologist for recommendation to treat his hemorrhoids  Orders Placed This Encounter  Procedures   Ferritin    Standing Status:   Future    Standing Expiration Date:   09/27/2024   Iron and Iron Binding Capacity ([facility] only)    Standing Status:   Future    Standing Expiration Date:   09/27/2024   CBC with Differential (Cancer Center Only)    Standing Status:   Future    Standing Expiration Date:   09/27/2024    The total time spent in the appointment was 30 minutes encounter with patients including review of chart and various tests results, discussions about plan of care and coordination of care plan   All questions were answered. The patient knows to call the clinic with any problems,  questions or concerns. No barriers to learning was detected.    Artis Delay, MD 11/8/20241:02 PM  INTERVAL HISTORY: Keith Long 62 y.o. male returns for further follow-up and evaluation for iron deficiency anemia due to chronic GI bleed Since last time I saw him, he has been having daily rectal bleeding He denies painful bowel movement He denies other forms of bleeding He has been complaining of fatigue for the past month We discussed test results and future follow-up  SUMMARY OF HEMATOLOGIC HISTORY:  He was found to have abnormal CBC from recent blood work I have the opportunity to review his CBC dated back more than 10 years He is noted to be anemic since 2018 intermittently Most recently, he had microcytic anemia with confirmed severe iron deficiency  He denies recent chest pain on exertion, shortness of breath on minimal exertion, pre-syncopal episodes, or palpitations. He complains of fatigue and intermittent leg cramps The patient had chronic gastritis as well as intermittent rectal bleeding He is scheduled for colonoscopy evaluation next week Last EGD evaluation was in December 2022 He has intermittent rectal bleeding several times a month He felt that he is occasionally passing hard stool  He had not noticed any recent bleeding such as epistaxis or hematuria  The patient denies over the counter NSAID ingestion. He is not on antiplatelets agents.  He had no prior history or diagnosis of cancer. His age appropriate screening programs are up-to-date. He denies any pica and eats a variety of diet. He never donated blood or received blood transfusion The patient was prescribed oral  iron supplements and he takes to daily in the morning for the past year without benefit He received intravenous iron in April and May with resolution of anemia   I have reviewed the past medical history, past surgical history, social history and family history with the patient and they are  unchanged from previous note.  ALLERGIES:  is allergic to augmentin [amoxicillin-pot clavulanate] and methylprednisolone.  MEDICATIONS:  Current Outpatient Medications  Medication Sig Dispense Refill   ADVAIR HFA 115-21 MCG/ACT inhaler Inhale 2 puffs into the lungs 2 (two) times daily.     albuterol (VENTOLIN HFA) 108 (90 Base) MCG/ACT inhaler Inhale 2 puffs into the lungs every 6 (six) hours as needed for wheezing or shortness of breath. 18 g 5   atorvastatin (LIPITOR) 10 MG tablet Take by mouth.     azelastine (ASTELIN) 0.1 % nasal spray Place 2 sprays into both nostrils 2 (two) times daily. Use in each nostril as directed 30 mL 12   Azelastine HCl 137 MCG/SPRAY SOLN TWO SPRAYS BY BOTH NOSTRILS ROUTE 2 (TWO) TIMES DAILY. Nasal     Cyanocobalamin (VITAMIN B-12 PO) Take by mouth.     diphenhydramine-acetaminophen (TYLENOL PM) 25-500 MG TABS tablet Take 1 tablet by mouth at bedtime as needed. Azelastine hydrochloride     fluticasone-salmeterol (ADVAIR HFA) 230-21 MCG/ACT inhaler Inhale 2 puffs into the lungs 2 (two) times daily. 1 each 12   hydrocortisone (ANUSOL-HC) 2.5 % rectal cream Apply topically.     loratadine (CLARITIN) 10 MG tablet Take by mouth.     montelukast (SINGULAIR) 10 MG tablet Take 1 tablet (10 mg total) by mouth at bedtime. 30 tablet 6   omeprazole (PRILOSEC) 20 MG capsule Take 1 capsule (20 mg total) by mouth 2 (two) times daily. Take 30 minutes before breakfast and dinner 60 capsule 2   No current facility-administered medications for this visit.     REVIEW OF SYSTEMS:   Constitutional: Denies fevers, chills or night sweats Eyes: Denies blurriness of vision Ears, nose, mouth, throat, and face: Denies mucositis or sore throat Respiratory: Denies cough, dyspnea or wheezes Cardiovascular: Denies palpitation, chest discomfort or lower extremity swelling Gastrointestinal:  Denies nausea, heartburn or change in bowel habits Skin: Denies abnormal skin rashes Lymphatics:  Denies new lymphadenopathy or easy bruising Neurological:Denies numbness, tingling or new weaknesses Behavioral/Psych: Mood is stable, no new changes  All other systems were reviewed with the patient and are negative.  PHYSICAL EXAMINATION: ECOG PERFORMANCE STATUS: 1 - Symptomatic but completely ambulatory  Vitals:   09/28/23 1219  BP: (!) 146/79  Pulse: 74  Resp: 18  Temp: 97.7 F (36.5 C)  SpO2: 98%   Filed Weights   09/28/23 1219  Weight: 164 lb 12.8 oz (74.8 kg)    GENERAL:alert, no distress and comfortable  LABORATORY DATA:  I have reviewed the data as listed     Component Value Date/Time   NA 139 02/05/2023 1433   K 3.8 02/05/2023 1433   CL 105 02/05/2023 1433   CO2 26 02/05/2023 1433   GLUCOSE 77 02/05/2023 1433   BUN 15 02/05/2023 1433   CREATININE 0.83 02/05/2023 1433   CALCIUM 9.9 02/05/2023 1433   PROT 8.0 02/05/2023 1433   ALBUMIN 4.2 02/05/2023 1433   AST 17 02/05/2023 1433   ALT 20 02/05/2023 1433   ALKPHOS 94 02/05/2023 1433   BILITOT 0.3 02/05/2023 1433    No results found for: "SPEP", "UPEP"  Lab Results  Component Value Date   WBC  5.7 09/28/2023   NEUTROABS 2.5 09/28/2023   HGB 8.5 (L) 09/28/2023   HCT 26.8 (L) 09/28/2023   MCV 75.5 (L) 09/28/2023   PLT 420 (H) 09/28/2023      Chemistry      Component Value Date/Time   NA 139 02/05/2023 1433   K 3.8 02/05/2023 1433   CL 105 02/05/2023 1433   CO2 26 02/05/2023 1433   BUN 15 02/05/2023 1433   CREATININE 0.83 02/05/2023 1433      Component Value Date/Time   CALCIUM 9.9 02/05/2023 1433   ALKPHOS 94 02/05/2023 1433   AST 17 02/05/2023 1433   ALT 20 02/05/2023 1433   BILITOT 0.3 02/05/2023 1433

## 2023-09-28 NOTE — Assessment & Plan Note (Signed)
He has significant drop of his hemoglobin with recurrent iron deficiency anemia again and reactive thrombocytosis This is due to his chronic hemorrhoidal bleeding I recommend 2 more doses of intravenous iron infusion and GI follow-up to fix his hemorrhoids The most likely cause of his anemia is due to chronic blood loss/malabsorption syndrome. We discussed some of the risks, benefits, and alternatives of intravenous iron infusions. The patient is symptomatic from anemia and the iron level is critically low. He tolerated oral iron supplement poorly and desires to achieved higher levels of iron faster for adequate hematopoesis. Some of the side-effects to be expected including risks of infusion reactions, phlebitis, headaches, nausea and fatigue.  The patient is willing to proceed. Patient education material was dispensed.  Goal is to keep ferritin level greater than 50 and resolution of anemia

## 2023-09-28 NOTE — Assessment & Plan Note (Signed)
The patient has daily hemorrhoidal bleeding He is colonoscopy from this year confirm evidence of internal hemorrhoids I will reach out to his gastroenterologist for recommendation to treat his hemorrhoids

## 2023-10-01 ENCOUNTER — Telehealth: Payer: Self-pay

## 2023-10-01 NOTE — Telephone Encounter (Signed)
-----   Message from Jenel Lucks sent at 09/29/2023 12:58 PM EST ----- Regarding: RE: mutual patient Yes, will do, thank you.  Bonita Quin,  Can you get him scheduled for an office visit to discuss further hemorrhoid management/banding? ----- Message ----- From: Artis Delay, MD Sent: 09/28/2023   1:04 PM EST To: Jenel Lucks, MD Subject: mutual patient                                 Hi Dr. Tomasa Rand,  I saw him today He had lost 5 g of hemoglobin from daily rectal bleeding Would you be able to see him and recommend additional treatment for his bleeding hemorrhoids?  Thanks, Ni

## 2023-10-01 NOTE — Telephone Encounter (Signed)
Pt is scheduled to see Dr. Tomasa Rand 10/12/23 at 2:30pm. Letter mailed to pt regarding appt.

## 2023-10-05 ENCOUNTER — Inpatient Hospital Stay: Payer: 59

## 2023-10-05 VITALS — BP 148/85 | HR 65 | Temp 98.1°F | Resp 18

## 2023-10-05 DIAGNOSIS — D500 Iron deficiency anemia secondary to blood loss (chronic): Secondary | ICD-10-CM

## 2023-10-05 MED ORDER — SODIUM CHLORIDE 0.9 % IV SOLN: INTRAVENOUS | Status: DC

## 2023-10-05 MED ORDER — SODIUM CHLORIDE 0.9 % IV SOLN
510.0000 mg | Freq: Once | INTRAVENOUS | Status: AC
  Administered 2023-10-05: 510 mg via INTRAVENOUS
  Filled 2023-10-05: qty 510

## 2023-10-05 NOTE — Patient Instructions (Signed)
Ferumoxytol Injection Qu es este medicamento? El FERUMOXITOL trata los niveles bajos de hierro en el cuerpo (anemia por deficiencia de hierro). El hierro es un mineral que cumple una funcin importante en la produccin de glbulos rojos, que llevan el oxgeno de los pulmones al resto del cuerpo. Este medicamento puede ser utilizado para otros usos; si tiene alguna pregunta consulte con su proveedor de atencin mdica o con su farmacutico. MARCAS COMUNES: Feraheme Qu le debo informar a mi profesional de la salud antes de tomar este medicamento? Necesitan saber si usted presenta alguno de los siguientes problemas o situaciones: Anemia no causada por niveles bajos de hierro Niveles altos de hierro en la sangre Tiene programada la realizacin de una resonancia magntica Una reaccin alrgica o inusual al hierro, a otros medicamentos, alimentos, colorantes o conservantes Si est embarazada o buscando quedar embarazada Si est amamantando a un beb Cmo debo utilizar este medicamento? Este medicamento se inyecta en una vena. Su equipo de atencin lo administra en un hospital o en un entorno clnico. Hable con su equipo de atencin sobre el uso de este medicamento en nios. Puede requerir atencin especial. Sobredosis: Pngase en contacto inmediatamente con un centro toxicolgico o una sala de urgencia si usted cree que haya tomado demasiado medicamento. ATENCIN: Este medicamento es solo para usted. No comparta este medicamento con nadie. Qu sucede si me olvido de una dosis? Es importante no olvidar ninguna dosis. Llame a su equipo de atencin si no puede asistir a una cita. Qu puede interactuar con este medicamento? Otros productos con hierro Puede ser que esta lista no menciona todas las posibles interacciones. Informe a su profesional de la salud de todos los productos a base de hierbas, medicamentos de venta libre o suplementos nutritivos que est tomando. Si usted fuma, consume bebidas  alcohlicas o si utiliza drogas ilegales, indqueselo tambin a su profesional de la salud. Algunas sustancias pueden interactuar con su medicamento. A qu debo estar atento al usar este medicamento? Visite peridicamente a su equipo de atencin. Si los sntomas no comienzan a mejorar o si empeoran, informe a su equipo de atencin. Usted podra necesitar realizarse anlisis de sangre mientras est usando este medicamento. Es posible que deba seguir una dieta especial. Hable con su equipo de atencin. Los alimentos que contienen hierro incluyen: granos enteros/cereales, frutas secas, legumbres, o guisantes, verduras de hojas verdes y asaduras (hgado, rin). Qu efectos secundarios puedo tener al utilizar este medicamento? Efectos secundarios que debe informar a su equipo de atencin tan pronto como sea posible: Reacciones alrgicas: erupcin cutnea, comezn/picazn, urticaria, hinchazn de la cara, los labios, la lengua o la garganta Presin arterial baja: mareo, sensacin de desmayo o aturdimiento, visin borrosa Falta de aliento Efectos secundarios que generalmente no requieren atencin mdica (debe informarlos a su equipo de atencin si persisten o si son molestos): Enrojecimiento Dolor de cabeza Dolor en las articulaciones Dolor muscular Nuseas Dolor, enrojecimiento o irritacin en el lugar de la inyeccin Puede ser que esta lista no menciona todos los posibles efectos secundarios. Comunquese a su mdico por asesoramiento mdico sobre los efectos secundarios. Usted puede informar los efectos secundarios a la FDA por telfono al 1-800-FDA-1088. Dnde debo guardar mi medicina? Este medicamento se administra en hospitales o clnicas y no es necesario guardarlo en su domicilio. ATENCIN: Este folleto es un resumen. Puede ser que no cubra toda la posible informacin. Si usted tiene preguntas acerca de esta medicina, consulte con su mdico, su farmacutico o su profesional de la salud.     2023 Elsevier/Gold Standard (2022-09-27 00:00:00)  

## 2023-10-12 ENCOUNTER — Ambulatory Visit: Payer: 59 | Admitting: Gastroenterology

## 2023-10-12 ENCOUNTER — Inpatient Hospital Stay: Payer: 59

## 2023-10-12 ENCOUNTER — Encounter: Payer: Self-pay | Admitting: Gastroenterology

## 2023-10-12 VITALS — BP 163/87 | HR 76 | Temp 98.2°F | Resp 16

## 2023-10-12 VITALS — BP 140/80 | HR 73 | Ht 65.0 in | Wt 165.0 lb

## 2023-10-12 DIAGNOSIS — K2100 Gastro-esophageal reflux disease with esophagitis, without bleeding: Secondary | ICD-10-CM | POA: Diagnosis not present

## 2023-10-12 DIAGNOSIS — K642 Third degree hemorrhoids: Secondary | ICD-10-CM | POA: Diagnosis not present

## 2023-10-12 DIAGNOSIS — D500 Iron deficiency anemia secondary to blood loss (chronic): Secondary | ICD-10-CM

## 2023-10-12 MED ORDER — SODIUM CHLORIDE 0.9 % IV SOLN
510.0000 mg | Freq: Once | INTRAVENOUS | Status: AC
  Administered 2023-10-12: 510 mg via INTRAVENOUS
  Filled 2023-10-12: qty 510

## 2023-10-12 MED ORDER — SODIUM CHLORIDE 0.9 % IV SOLN
INTRAVENOUS | Status: DC
Start: 2023-10-12 — End: 2023-10-12

## 2023-10-12 NOTE — Patient Instructions (Signed)
You have been scheduled for a Hemorrhoid banding 01/04/24 @ 11:10 am.  _______________________________________________________  If your blood pressure at your visit was 140/90 or greater, please contact your primary care physician to follow up on this.  _______________________________________________________  If you are age 60 or older, your body mass index should be between 23-30. Your Body mass index is 27.46 kg/m. If this is out of the aforementioned range listed, please consider follow up with your Primary Care Provider.  If you are age 70 or younger, your body mass index should be between 19-25. Your Body mass index is 27.46 kg/m. If this is out of the aformentioned range listed, please consider follow up with your Primary Care Provider.   ________________________________________________________  The [place] [facility] providers would like to encourage you to use [facility] to communicate with providers for non-urgent requests or questions.  Due to long hold times on the telephone, sending your provider a message by [facility] may be a faster and more efficient way to get a response.  Please allow 48 business hours for a response.  Please remember that this is for non-urgent requests.   It was a pleasure to see you today!  Thank you for trusting me with your gastrointestinal care!    Scott E.Tomasa Rand, MD

## 2023-10-12 NOTE — Patient Instructions (Signed)
 Ferumoxytol Injection What is this medication? FERUMOXYTOL (FER ue MOX i tol) treats low levels of iron in your body (iron deficiency anemia). Iron is a mineral that plays an important role in making red blood cells, which carry oxygen from your lungs to the rest of your body. This medicine may be used for other purposes; ask your health care provider or pharmacist if you have questions. COMMON BRAND NAME(S): Feraheme What should I tell my care team before I take this medication? They need to know if you have any of these conditions: Anemia not caused by low iron levels High levels of iron in the blood Magnetic resonance imaging (MRI) test scheduled An unusual or allergic reaction to iron, other medications, foods, dyes, or preservatives Pregnant or trying to get pregnant Breastfeeding How should I use this medication? This medication is injected into a vein. It is given by your care team in a hospital or clinic setting. Talk to your care team the use of this medication in children. Special care may be needed. Overdosage: If you think you have taken too much of this medicine contact a poison control center or emergency room at once. NOTE: This medicine is only for you. Do not share this medicine with others. What if I miss a dose? It is important not to miss your dose. Call your care team if you are unable to keep an appointment. What may interact with this medication? Other iron products This list may not describe all possible interactions. Give your health care provider a list of all the medicines, herbs, non-prescription drugs, or dietary supplements you use. Also tell them if you smoke, drink alcohol, or use illegal drugs. Some items may interact with your medicine. What should I watch for while using this medication? Visit your care team regularly. Tell your care team if your symptoms do not start to get better or if they get worse. You may need blood work done while you are taking this  medication. You may need to follow a special diet. Talk to your care team. Foods that contain iron include: whole grains/cereals, dried fruits, beans, or peas, leafy green vegetables, and organ meats (liver, kidney). What side effects may I notice from receiving this medication? Side effects that you should report to your care team as soon as possible: Allergic reactions--skin rash, itching, hives, swelling of the face, lips, tongue, or throat Low blood pressure--dizziness, feeling faint or lightheaded, blurry vision Shortness of breath Side effects that usually do not require medical attention (report to your care team if they continue or are bothersome): Flushing Headache Joint pain Muscle pain Nausea Pain, redness, or irritation at injection site This list may not describe all possible side effects. Call your doctor for medical advice about side effects. You may report side effects to FDA at 1-800-FDA-1088. Where should I keep my medication? This medication is given in a hospital or clinic. It will not be stored at home. NOTE: This sheet is a summary. It may not cover all possible information. If you have questions about this medicine, talk to your doctor, pharmacist, or health care provider.  2024 Elsevier/Gold Standard (2023-04-13 00:00:00)

## 2023-10-12 NOTE — Progress Notes (Unsigned)
HPI :   His most recent ferritin level earlier this month was 3, down from 121 earlier this year His hemoglobin was 8.5, down from 13 in June of this year.  Labs 12/16/2021: Iron level of 43. Iron saturation 12%. Ferritin 27.   Labs 02/22/2022:  Iron level 104.  Iron saturation 30%.  Ferritin 37.   Labs 05/31/2022 Iron level 91. Iron saturation 4%. Ferritin 21.  Hg 13.3.  Hematocrit 39.1.  MCV 85.8. Labs 08/31/2022: Iron 63.  Iron saturation 16%.  Ferritin 18.       Colonoscopy April 2024 Large internal hemorrhoids Pancolonic diverticulosis Otherwise normal, recommended repeat colonoscopy 10 years   EGD April 2024 - Z-line irregular.  - 3 cm hiatal hernia.  - Localized irregular gastric mucosa. Biopsied to exclude dysplasia.  - Normal examined duodenum. Biopsied.  - Biopsies were taken with a cold forceps for Helicobacter pylori testing  1. Surgical [P], duodenal bulb, 2nd portion of duodenum and distal duodenum DUODENAL MUCOSA WITH NORMAL VILLOUS ARCHITECTURE. NO VILLOUS ATROPHY OR INCREASED INTRAEPITHELIAL LYMPHOCYTES. 2. Surgical [P], gastric antrum and gastric body ANTRAL AND OXYNTIC MUCOSA WITH CHANGES OF REACTIVE GASTROPATHY AND SLIGHT CHRONIC INFLAMMATION. NEGATIVE FOR HELICOBACTER PYLORI. 3. Surgical [P], abnormal mucosa gastric body OXYNTIC MUCOSA WITH FOCAL INTESTINAL METAPLASIA AND SLIGHT CHRONIC INFLAMMATION. NEGATIVE FOR HELICOBACTER PYLORI. NEGATIVE FOR DYSPLASIA OR MALIGNANCY.  EGD 11/18/2021: - The examined portions of the nasopharynx, oropharynx and larynx were normal.  - Erythema in the lower third of the esophagus.  - Z-line irregular with a single island of salmon colored mucosa.  - 3 cm hiatal hernia.  - Normal examined duodenum.  - No specimens collected.   EGD 08/05/2021: - The examined portions of the nasopharynx, oropharynx and larynx were normal.  - Z-line irregular.  - LA Grade B reflux esophagitis with no bleeding.  - 2 cm hiatal hernia.   - Normal examined duodenum.  - No specimens collected.   Past Medical History:  Diagnosis Date   Allergy    Anemia    Arthritis    Asthma    Dr. Sherene Sires   GERD (gastroesophageal reflux disease)    Hemorrhoids    Hyperlipidemia    Rectal bleeding    Right inguinal hernia 2016   Dr. Sheliah Hatch     Past Surgical History:  Procedure Laterality Date   COLONOSCOPY     HEMORRHOID SURGERY     local injection   NOSE SURGERY  11/20/1990   UPPER GASTROINTESTINAL ENDOSCOPY     Family History  Problem Relation Age of Onset   Diabetes Mother    Stroke Father    Colon cancer Neg Hx    Prostate cancer Neg Hx    Esophageal cancer Neg Hx    Rectal cancer Neg Hx    Stomach cancer Neg Hx    Social History   Tobacco Use   Smoking status: Never   Smokeless tobacco: Never  Vaping Use   Vaping status: Never Used  Substance Use Topics   Alcohol use: Yes    Comment: occasional   Drug use: No   Current Outpatient Medications  Medication Sig Dispense Refill   ADVAIR HFA 115-21 MCG/ACT inhaler Inhale 2 puffs into the lungs 2 (two) times daily.     albuterol (VENTOLIN HFA) 108 (90 Base) MCG/ACT inhaler Inhale 2 puffs into the lungs every 6 (six) hours as needed for wheezing or shortness of breath. 18 g 5   atorvastatin (LIPITOR) 10 MG tablet Take by  mouth.     azelastine (ASTELIN) 0.1 % nasal spray Place 2 sprays into both nostrils 2 (two) times daily. Use in each nostril as directed 30 mL 12   Azelastine HCl 137 MCG/SPRAY SOLN TWO SPRAYS BY BOTH NOSTRILS ROUTE 2 (TWO) TIMES DAILY. Nasal     Cyanocobalamin (VITAMIN B-12 PO) Take by mouth.     diphenhydramine-acetaminophen (TYLENOL PM) 25-500 MG TABS tablet Take 1 tablet by mouth at bedtime as needed. Azelastine hydrochloride     fluticasone-salmeterol (ADVAIR HFA) 230-21 MCG/ACT inhaler Inhale 2 puffs into the lungs 2 (two) times daily. 1 each 12   hydrocortisone (ANUSOL-HC) 2.5 % rectal cream Apply topically.     loratadine (CLARITIN)  10 MG tablet Take by mouth.     montelukast (SINGULAIR) 10 MG tablet Take 1 tablet (10 mg total) by mouth at bedtime. 30 tablet 6   omeprazole (PRILOSEC) 20 MG capsule Take 1 capsule (20 mg total) by mouth 2 (two) times daily. Take 30 minutes before breakfast and dinner 60 capsule 2   No current facility-administered medications for this visit.   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  See ophthalmology note from 02/11/2018     Review of Systems: All systems reviewed and negative except where noted in HPI.    No results found.  Physical Exam: There were no vitals taken for this visit. Constitutional: Pleasant,well-developed, ***male in no acute distress. HEENT: Normocephalic and atraumatic. Conjunctivae are normal. No scleral icterus. Neck supple.  Cardiovascular: Normal rate, regular rhythm.  Pulmonary/chest: Effort normal and breath sounds normal. No wheezing, rales or rhonchi. Abdominal: Soft, nondistended, nontender. Bowel sounds active throughout. There are no masses palpable. No hepatomegaly. Extremities: no edema Lymphadenopathy: No cervical adenopathy noted. Neurological: Alert and oriented to person place and time. Skin: Skin is warm and dry. No rashes noted. Psychiatric: Normal mood and affect. Behavior is normal.  CBC    Component Value Date/Time   WBC 5.7 09/28/2023 1200   WBC 6.9 02/12/2023 0957   RBC 3.55 (L) 09/28/2023 1200   HGB 8.5 (L) 09/28/2023 1200   HCT 26.8 (L) 09/28/2023 1200   PLT 420 (H) 09/28/2023 1200   MCV 75.5 (L)  09/28/2023 1200   MCH 23.9 (L) 09/28/2023 1200   MCHC 31.7 09/28/2023 1200   RDW 17.9 (H) 09/28/2023 1200   LYMPHSABS 2.2 09/28/2023 1200   MONOABS 0.5 09/28/2023 1200   EOSABS 0.4 09/28/2023 1200   BASOSABS 0.1 09/28/2023 1200    CMP     Component Value Date/Time   NA 139 02/05/2023 1433   K 3.8 02/05/2023 1433   CL 105 02/05/2023 1433   CO2 26 02/05/2023 1433   GLUCOSE 77 02/05/2023 1433   BUN 15 02/05/2023 1433   CREATININE 0.83 02/05/2023 1433   CALCIUM 9.9 02/05/2023 1433   PROT 8.0 02/05/2023 1433   ALBUMIN 4.2 02/05/2023 1433   AST 17 02/05/2023 1433   ALT 20 02/05/2023 1433   ALKPHOS 94 02/05/2023 1433   BILITOT 0.3 02/05/2023 1433       Latest Ref Rng &  Units 09/28/2023   12:00 PM 04/30/2023    3:13 PM 02/12/2023    9:57 AM  CBC EXTENDED  WBC 4.0 - 10.5 K/uL 5.7  7.5  6.9   RBC 4.22 - 5.81 MIL/uL 3.55  4.93  4.56   Hemoglobin 13.0 - 17.0 g/dL 8.5  16.1  09.6   HCT 04.5 - 52.0 % 26.8  40.5  34.8   Platelets 150 - 400 K/uL 420  287  321.0   NEUT# 1.7 - 7.7 K/uL 2.5  3.7  3.2   Lymph# 0.7 - 4.0 K/uL 2.2  3.0  2.4       ASSESSMENT AND PLAN:  Jordan Hawks, PA-C

## 2023-10-12 NOTE — Progress Notes (Signed)
Patient declined post iron infusion observation.  Tolerated treatment well without incident.  VSS at discharge.  Denied light headedness, SOB, dizziness.  Patient states he will monitor BP at home and follow up with PCP regarding BP.  Ambulated to lobby.

## 2023-12-18 ENCOUNTER — Encounter: Payer: Self-pay | Admitting: Pulmonary Disease

## 2023-12-18 ENCOUNTER — Ambulatory Visit: Payer: 59 | Admitting: Pulmonary Disease

## 2023-12-18 VITALS — BP 156/82 | HR 80 | Temp 98.2°F | Ht 65.0 in | Wt 172.2 lb

## 2023-12-18 DIAGNOSIS — G4733 Obstructive sleep apnea (adult) (pediatric): Secondary | ICD-10-CM

## 2023-12-18 MED ORDER — AZITHROMYCIN 250 MG PO TABS: ORAL_TABLET | ORAL | 0 refills | Status: DC

## 2023-12-18 MED ORDER — DOXYCYCLINE HYCLATE 100 MG PO TABS: 100.0000 mg | ORAL_TABLET | Freq: Two times a day (BID) | ORAL | 0 refills | Status: DC

## 2023-12-18 MED ORDER — AZELASTINE HCL 0.1 % NA SOLN: 2.0000 | Freq: Two times a day (BID) | NASAL | 12 refills | Status: AC

## 2023-12-18 MED ORDER — FLUTICASONE-SALMETEROL 230-21 MCG/ACT IN AERO: 2.0000 | INHALATION_SPRAY | Freq: Two times a day (BID) | RESPIRATORY_TRACT | 12 refills | Status: AC

## 2023-12-18 NOTE — Patient Instructions (Addendum)
I will see you in 6 months  Prescription for azithromycin sent to pharmacy for you, your other medications were refilled as well  Continue using your CPAP nightly  Call us with significant concerns

## 2023-12-18 NOTE — Progress Notes (Signed)
Keith Long    [id number]    October 06, 1961  Primary Care Physician:Gordon, Gaye Alken, PA-C  Referring Physician: Jordan Hawks, PA-C [address] [place],  [place] [address]  Chief complaint:   Patient being seen for asthma History of obstructive sleep apnea  HPI:  Nasal stuffiness, congestion, yellow mucus  Tolerating CPAP Waking up feeling like he is at a good nights rest  His shiftwork sometimes affects how much of his CPAP uses  history of asthma diagnosed about 10 years ago, triggers include changes in weather  Usually goes to bed between 9 and 10 PM Wakes up at 5 AM 2 awakenings Does have some dryness of his mouth in the morning Has headaches around-the-clock Memory is poor  Admits to wheezing He does have a nebulizer to use as needed -Uses albuterol about once a week or less  Shortness of breath on exertion  Does have nasal stuffiness and congestion for which he is on Astelin at present Did use Flonase but this was giving him some side effects  History of hypertension, shortness of breath with activity wheezing with activity  Outpatient Encounter Medications as of 12/18/2023  Medication Sig   atorvastatin (LIPITOR) 10 MG tablet Take by mouth.   azelastine (ASTELIN) 0.1 % nasal spray Place 2 sprays into both nostrils 2 (two) times daily. Use in each nostril as directed   Cyanocobalamin (VITAMIN B-12 PO) Take by mouth.   diphenhydramine-acetaminophen (TYLENOL PM) 25-500 MG TABS tablet Take 1 tablet by mouth at bedtime as needed. Azelastine hydrochloride   fluticasone-salmeterol (ADVAIR HFA) 230-21 MCG/ACT inhaler Inhale 2 puffs into the lungs 2 (two) times daily.   hydrocortisone (ANUSOL-HC) 2.5 % rectal cream Apply topically.   loratadine (CLARITIN) 10 MG tablet Take by mouth.   montelukast (SINGULAIR) 10 MG tablet Take 1 tablet (10 mg total) by mouth at bedtime.   omeprazole (PRILOSEC) 20 MG capsule Take 1 capsule (20 mg total) by  mouth 2 (two) times daily. Take 30 minutes before breakfast and dinner   [DISCONTINUED] ADVAIR HFA 115-21 MCG/ACT inhaler Inhale 2 puffs into the lungs 2 (two) times daily. (Patient not taking: Reported on 12/18/2023)   [DISCONTINUED] Azelastine HCl 137 MCG/SPRAY SOLN TWO SPRAYS BY BOTH NOSTRILS ROUTE 2 (TWO) TIMES DAILY. Nasal (Patient not taking: Reported on 12/18/2023)   No facility-administered encounter medications on file as of 12/18/2023.    Allergies as of 12/18/2023 - Review Complete 12/18/2023  Allergen Reaction Noted   Augmentin [amoxicillin-pot clavulanate] Nausea Only 08/09/2015   Methylprednisolone  03/22/2018    Past Medical History:  Diagnosis Date   Allergy    Anemia    Arthritis    Asthma    Dr. Sherene Sires   GERD (gastroesophageal reflux disease)    Hemorrhoids    Hyperlipidemia    Rectal bleeding    Right inguinal hernia 2016   Dr. Sheliah Hatch    Past Surgical History:  Procedure Laterality Date   COLONOSCOPY     HEMORRHOID SURGERY     local injection   NOSE SURGERY  11/20/1990   UPPER GASTROINTESTINAL ENDOSCOPY      Family History  Problem Relation Age of Onset   Diabetes Mother    Stroke Father    Colon cancer Neg Hx    Prostate cancer Neg Hx    Esophageal cancer Neg Hx    Rectal cancer Neg Hx    Stomach cancer Neg Hx  Social History   Socioeconomic History   Marital status: Married    Spouse name: Not on file   Number of children: 3   Years of education: Not on file   Highest education level: Not on file  Occupational History   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[employer])  Tobacco Use   Smoking status: Never   Smokeless tobacco: Never  Vaping Use   Vaping status: Never Used  Substance and Sexual Activity   Alcohol use: Yes    Comment: occasional   Drug use: No   Sexual activity: Not on file  Other Topics Concern   Not on file  Social History Narrative   [place]   Divorced, lives w/ g-friend    3 daughters    Social  Drivers of Corporate investment banker Strain: Low Risk  (12/08/2022)   Received from [employer], [facility]   Overall Financial Resource Strain (CARDIA)    Difficulty of Paying Living Expenses: Not hard at all  Food Insecurity: Low Risk  (07/09/2023)   Received from [facility]   Hunger Vital Sign    Worried About Running Out of Food in the Last Year: Never true    Ran Out of Food in the Last Year: Never true  Transportation Needs: No Transportation Needs (07/09/2023)   Received from [facility]    In the past 12 months, has lack of reliable transportation kept you from medical appointments, meetings, work or from getting things needed for daily living? : No  Physical Activity: Insufficiently Active (12/08/2022)   Received from [facility], [facility]   Exercise Vital Sign    Days of Exercise per Week: 2 days    Minutes of Exercise per Session: 20 min  Stress: No Stress Concern Present (12/08/2022)   Received from [facility], [facility]   [employer] [facility] - Occupational Stress Questionnaire    Feeling of Stress : Only a little  Social Connections: Somewhat Isolated (12/08/2022)   Received from [facility], [facility]   Social Network    How would you rate your social network (family, work, friends)?: Restricted participation with some degree of social isolation  Intimate Partner Violence: Not At Risk (12/08/2022)   Received from [facility], [facility]   HITS    Over the last 12 months how often did your partner physically hurt you?: Never    Over the last 12 months how often did your partner insult you or talk down to you?: Never    Over the last 12 months how often did your partner threaten you with physical harm?: Never    Over the last 12 months how often did your partner scream or curse at you?: Never    Review of Systems  Respiratory:  Positive for apnea, cough, shortness of breath and wheezing.    Psychiatric/Behavioral:  Positive for sleep disturbance.     Vitals:   12/18/23 1413  BP: (!) 156/82  Pulse: 80  Temp: 98.2 F (36.8 C)  SpO2: 96%      Physical Exam Constitutional:      Appearance: Normal appearance.  HENT:     Head: Normocephalic.     Mouth/Throat:     Mouth: Mucous membranes are moist.  Eyes:     General: No scleral icterus.    Pupils: Pupils are equal, round, and reactive to light.  Cardiovascular:     Rate and Rhythm: Normal rate and regular rhythm.  Heart sounds: No murmur heard.    No friction rub.  Pulmonary:     Effort: No respiratory distress.     Breath sounds: No stridor. No wheezing or rhonchi.  Musculoskeletal:     Cervical back: No rigidity or tenderness.  Neurological:     Mental Status: He is alert.  Psychiatric:        Mood and Affect: Mood normal.    Data Reviewed: Sleep study from January 2024 revealed AHI of 54 -Full report not available for me to review at present  Compliance data reviewed showing 90% compliance Average use of 4 hours AutoSet 5-20 Residual AHI 7.2  Assessment:  History of severe obstructive sleep apnea -Continue using your CPAP nightly  Sinus fullness and congestion -Prescription for azithromycin sent to pharmacy for you  Your other medications were refilled   Plan/Recommendations: Continue using CPAP on a nightly basis  Prescription for doxycycline sent to pharmacy  Refills for azelastine, Advair sent to pharmacy  Follow-up in 6 months  Call us with significant concerns   Virl Diamond MD [facility] 12/18/2023, 2:27 PM  CC: Jordan Hawks, PA-C

## 2024-01-04 ENCOUNTER — Encounter: Payer: Self-pay | Admitting: Hematology and Oncology

## 2024-01-04 ENCOUNTER — Encounter: Payer: Self-pay | Admitting: Gastroenterology

## 2024-01-04 ENCOUNTER — Ambulatory Visit: Payer: 59 | Admitting: Gastroenterology

## 2024-01-04 VITALS — BP 134/80 | HR 69 | Ht 65.0 in | Wt 167.0 lb

## 2024-01-04 DIAGNOSIS — K642 Third degree hemorrhoids: Secondary | ICD-10-CM

## 2024-01-04 NOTE — Progress Notes (Signed)
Mr. Stave continues to be bothered by symptomatic hemorrhoids, with symptoms of frequent rectal bleeding, rectal pressure/discomfort prolapsed.  He has had significant anemia which has been attributed to his chronic hemorrhoidal bleeding.  He would like to proceed with hemorrhoid banding.   PROCEDURE NOTE: The patient presents with symptomatic grade 3  hemorrhoids, requesting rubber band ligation of his/her hemorrhoidal disease.  All risks, benefits and alternative forms of therapy were described and informed consent was obtained.   The anorectum was pre-medicated with topical lidocaine (5%) and nitroglycerin (0.125%) The decision was made to band the left lateral internal hemorrhoid, and the [facility] O'Regan System was used to perform band ligation without complication.  Digital anorectal examination was then performed to assure proper positioning of the band, and to adjust the banded tissue as required.  The patient was discharged home without pain or other issues.  Dietary and behavioral recommendations were given and along with follow-up instructions.     The following adjunctive treatments were recommended:  Fiber supplementation Adequate water intake Avoidance of straining and hard stools  The patient will return in 2-4 weeks for  follow-up and possible additional banding as required. No complications were encountered and the patient tolerated the procedure well.

## 2024-01-04 NOTE — Patient Instructions (Signed)
HEMORRHOID BANDING PROCEDURE    FOLLOW-UP CARE   The procedure you have had should have been relatively painless since the banding of the area involved does not have nerve endings and there is no pain sensation.  The rubber band cuts off the blood supply to the hemorrhoid and the band may fall off as soon as 48 hours after the banding (the band may occasionally be seen in the toilet bowl following a bowel movement). You may notice a temporary feeling of fullness in the rectum which should respond adequately to plain Tylenol or Motrin.  Following the banding, avoid strenuous exercise that evening and resume full activity the next day.  A sitz bath (soaking in a warm tub) or bidet is soothing, and can be useful for cleansing the area after bowel movements.     To avoid constipation, take two tablespoons of natural wheat bran, natural oat bran, flax, Benefiber or any over the counter fiber supplement and increase your water intake to 7-8 glasses daily.    Unless you have been prescribed anorectal medication, do not put anything inside your rectum for two weeks: No suppositories, enemas, fingers, etc.  Occasionally, you may have more bleeding than usual after the banding procedure.  This is often from the untreated hemorrhoids rather than the treated one.  Don't be concerned if there is a tablespoon or so of blood.  If there is more blood than this, lie flat with your bottom higher than your head and apply an ice pack to the area. If the bleeding does not stop within a half an hour or if you feel faint, call our office at [phone] or go to the emergency room.  Problems are not common; however, if there is a substantial amount of bleeding, severe pain, chills, fever or difficulty passing urine (very rare) or other problems, you should call us at [phone] or report to the nearest emergency room.  Do not stay seated continuously for more than 2-3 hours for a day or two after the procedure.   Tighten your buttock muscles 10-15 times every two hours and take 10-15 deep breaths every 1-2 hours.  Do not spend more than a few minutes on the toilet if you cannot empty your bowel; instead re-visit the toilet at a later time.    It was a pleasure to see you today!  Thank you for trusting me with your gastrointestinal care!    Scott E.Tomasa Rand, MD

## 2024-03-14 ENCOUNTER — Ambulatory Visit: Payer: 59 | Admitting: Gastroenterology

## 2024-03-14 ENCOUNTER — Encounter: Payer: Self-pay | Admitting: Gastroenterology

## 2024-03-14 VITALS — BP 130/70 | HR 72 | Ht 65.0 in | Wt 164.0 lb

## 2024-03-14 DIAGNOSIS — K642 Third degree hemorrhoids: Secondary | ICD-10-CM

## 2024-03-14 NOTE — Progress Notes (Signed)
 Mr. Fuess states that his first hemorrhoid banding was well tolerated.  He had some discomfort the first 24 hours, but it was not severe.  He has noted some improvement in his symptoms, particularly in the amount and frequency of bleeding.  He reports regular bowel movements, without hard stools, straining or prolonged toilet time.  Jan 04, 2024:  Left lateral column  PROCEDURE NOTE: The patient presents with symptomatic grade 3  hemorrhoids, requesting rubber band ligation of his/her hemorrhoidal disease.  All risks, benefits and alternative forms of therapy were described and informed consent was obtained.   The anorectum was pre-medicated with topical lidocaine (5%) and nitroglycerin (0.125%) The decision was made to band the right posterior internal hemorrhoid, and the [facility] O'Regan System was used to perform band ligation without complication.  Digital anorectal examination was then performed to assure proper positioning of the band, and to adjust the banded tissue as required.  The patient was discharged home without pain or other issues.  Dietary and behavioral recommendations were given and along with follow-up instructions.     The following adjunctive treatments were recommended:  Fiber supplementation Adequate water intake Avoidance of straining and hard stools  The patient will return in 2-4 weeks for  follow-up and possible additional banding as required. No complications were encountered and the patient tolerated the procedure well.

## 2024-03-14 NOTE — Patient Instructions (Addendum)
 HEMORRHOID BANDING PROCEDURE    FOLLOW-UP CARE   The procedure you have had should have been relatively painless since the banding of the area involved does not have nerve endings and there is no pain sensation.  The rubber band cuts off the blood supply to the hemorrhoid and the band may fall off as soon as 48 hours after the banding (the band may occasionally be seen in the toilet bowl following a bowel movement). You may notice a temporary feeling of fullness in the rectum which should respond adequately to plain Tylenol or Motrin.  Following the banding, avoid strenuous exercise that evening and resume full activity the next day.  A sitz bath (soaking in a warm tub) or bidet is soothing, and can be useful for cleansing the area after bowel movements.     To avoid constipation, take two tablespoons of natural wheat bran, natural oat bran, flax, Benefiber or any over the counter fiber supplement and increase your water intake to 7-8 glasses daily.    Unless you have been prescribed anorectal medication, do not put anything inside your rectum for two weeks: No suppositories, enemas, fingers, etc.  Occasionally, you may have more bleeding than usual after the banding procedure.  This is often from the untreated hemorrhoids rather than the treated one.  Don't be concerned if there is a tablespoon or so of blood.  If there is more blood than this, lie flat with your bottom higher than your head and apply an ice pack to the area. If the bleeding does not stop within a half an hour or if you feel faint, call our office at [phone] or go to the emergency room.  Problems are not common; however, if there is a substantial amount of bleeding, severe pain, chills, fever or difficulty passing urine (very rare) or other problems, you should call us  at [phone] or report to the nearest emergency room.  Do not stay seated continuously for more than 2-3 hours for a day or two after the procedure.   Tighten your buttock muscles 10-15 times every two hours and take 10-15 deep breaths every 1-2 hours.  Do not spend more than a few minutes on the toilet if you cannot empty your bowel; instead re-visit the toilet at a later time.     If your blood pressure at your visit was 140/90 or greater, please contact your primary care physician to follow up on this.  _______________________________________________________  If you are age 61 or older, your body mass index should be between 23-30. Your Body mass index is 27.29 kg/m. If this is out of the aforementioned range listed, please consider follow up with your Primary Care Provider.  If you are age 61 or younger, your body mass index should be between 19-25. Your Body mass index is 27.29 kg/m. If this is out of the aformentioned range listed, please consider follow up with your Primary Care Provider.   ________________________________________________________  The [facility] providers would like to encourage you to use MYCHART to communicate with providers for non-urgent requests or questions.  Due to long hold times on the telephone, sending your provider a message by [facility] may be a faster and more efficient way to get a response.  Please allow 48 business hours for a response.  Please remember that this is for non-urgent requests.  _______________________________________________________  Next hemorrhoid banding 05/16/24 @ 1:50pm

## 2024-03-21 ENCOUNTER — Encounter: Payer: Self-pay | Admitting: Hematology and Oncology

## 2024-03-21 ENCOUNTER — Inpatient Hospital Stay: Payer: 59 | Admitting: Hematology and Oncology

## 2024-03-21 ENCOUNTER — Inpatient Hospital Stay: Payer: 59 | Attending: Hematology and Oncology

## 2024-03-21 VITALS — BP 173/80 | HR 61 | Temp 97.9°F | Resp 18 | Wt 165.8 lb

## 2024-03-21 DIAGNOSIS — D500 Iron deficiency anemia secondary to blood loss (chronic): Secondary | ICD-10-CM

## 2024-03-21 DIAGNOSIS — K649 Unspecified hemorrhoids: Secondary | ICD-10-CM | POA: Insufficient documentation

## 2024-03-21 LAB — CBC WITH DIFFERENTIAL (CANCER CENTER ONLY)
Abs Immature Granulocytes: 0.01 10*3/uL (ref 0.00–0.07)
Basophils Absolute: 0 10*3/uL (ref 0.0–0.1)
Basophils Relative: 1 %
Eosinophils Absolute: 0.4 10*3/uL (ref 0.0–0.5)
Eosinophils Relative: 6 %
HCT: 40.8 % (ref 39.0–52.0)
Hemoglobin: 14.2 g/dL (ref 13.0–17.0)
Immature Granulocytes: 0 %
Lymphocytes Relative: 37 %
Lymphs Abs: 2.5 10*3/uL (ref 0.7–4.0)
MCH: 30.2 pg (ref 26.0–34.0)
MCHC: 34.8 g/dL (ref 30.0–36.0)
MCV: 86.8 fL (ref 80.0–100.0)
Monocytes Absolute: 0.6 10*3/uL (ref 0.1–1.0)
Monocytes Relative: 9 %
Neutro Abs: 3.2 10*3/uL (ref 1.7–7.7)
Neutrophils Relative %: 47 %
Platelet Count: 286 10*3/uL (ref 150–400)
RBC: 4.7 MIL/uL (ref 4.22–5.81)
RDW: 14.1 % (ref 11.5–15.5)
WBC Count: 6.8 10*3/uL (ref 4.0–10.5)
nRBC: 0 % (ref 0.0–0.2)

## 2024-03-21 LAB — IRON AND IRON BINDING CAPACITY (CC-WL,HP ONLY)
Iron: 135 ug/dL (ref 45–182)
Saturation Ratios: 37 % (ref 17.9–39.5)
TIBC: 370 ug/dL (ref 250–450)
UIBC: 235 ug/dL (ref 117–376)

## 2024-03-21 LAB — FERRITIN: Ferritin: 56 ng/mL (ref 24–336)

## 2024-03-21 NOTE — Assessment & Plan Note (Addendum)
 He has no further hemorrhoidal bleeding Iron deficiency anemia has resolved He does not need long-term follow-up He will continue monitoring of blood count through his primary care doctor, and he will call me if he becomes anemic again

## 2024-03-21 NOTE — Progress Notes (Signed)
   [facility] OFFICE PROGRESS NOTE  Tova Fresh, PA-C  ASSESSMENT & PLAN:  Assessment & Plan Iron deficiency anemia due to chronic blood loss He has no further hemorrhoidal bleeding Iron deficiency anemia has resolved He does not need long-term follow-up He will continue monitoring of blood count through his primary care doctor, and he will call me if he becomes anemic again Hemorrhoids, unspecified hemorrhoid type He has several procedures performed by gastroenterologist to treat his hemorrhoids and have no further bleeding recently I would defer to his GI doctor for management    No orders of the defined types were placed in this encounter.   INTERVAL HISTORY: Patient returns for history of iron deficiency anemia, treated with intravenous iron infusion He has been receiving treatment for hemorrhoids.  He denies further bleeding since our last visit Symptoms of anemia includes none We reviewed CBC results  SUMMARY OF HEMATOLOGIC HISTORY: He was found to have abnormal CBC from recent blood work I have the opportunity to review his CBC dated back more than 10 years He is noted to be anemic since 2018 intermittently Most recently, he had microcytic anemia with confirmed severe iron deficiency  He denies recent chest pain on exertion, shortness of breath on minimal exertion, pre-syncopal episodes, or palpitations. He complains of fatigue and intermittent leg cramps The patient had chronic gastritis as well as intermittent rectal bleeding He has intermittent rectal bleeding several times a month He felt that he is occasionally passing hard stool Endoscopic evaluation in 2024 show evidence of hemorrhoids He had not noticed any recent bleeding such as epistaxis or hematuria  The patient denies over the counter NSAID ingestion. He is not on antiplatelets agents.  He had no prior history or diagnosis of cancer. His age appropriate screening programs are  up-to-date. He denies any pica and eats a variety of diet. He never donated blood or received blood transfusion The patient was prescribed oral iron supplements and he takes to daily in the morning for the past year without benefit He received intravenous iron in April, May, and November 2024 with resolution of anemia

## 2024-03-21 NOTE — Assessment & Plan Note (Addendum)
 He has several procedures performed by gastroenterologist to treat his hemorrhoids and have no further bleeding recently I would defer to his GI doctor for management

## 2024-04-17 ENCOUNTER — Encounter: Admitting: Gastroenterology

## 2024-05-16 ENCOUNTER — Encounter: Payer: Self-pay | Admitting: Gastroenterology

## 2024-05-16 ENCOUNTER — Ambulatory Visit: Admitting: Gastroenterology

## 2024-05-16 VITALS — BP 136/80 | HR 76 | Ht 65.0 in | Wt 165.0 lb

## 2024-05-16 DIAGNOSIS — K642 Third degree hemorrhoids: Secondary | ICD-10-CM | POA: Diagnosis not present

## 2024-05-16 NOTE — Patient Instructions (Addendum)
 HEMORRHOID BANDING PROCEDURE    FOLLOW-UP CARE   The procedure you have had should have been relatively painless since the banding of the area involved does not have nerve endings and there is no pain sensation.  The rubber band cuts off the blood supply to the hemorrhoid and the band may fall off as soon as 48 hours after the banding (the band may occasionally be seen in the toilet bowl following a bowel movement). You may notice a temporary feeling of fullness in the rectum which should respond adequately to plain Tylenol or Motrin.  Following the banding, avoid strenuous exercise that evening and resume full activity the next day.  A sitz bath (soaking in a warm tub) or bidet is soothing, and can be useful for cleansing the area after bowel movements.     To avoid constipation, take two tablespoons of natural wheat bran, natural oat bran, flax, Benefiber or any over the counter fiber supplement and increase your water intake to 7-8 glasses daily.    Unless you have been prescribed anorectal medication, do not put anything inside your rectum for two weeks: No suppositories, enemas, fingers, etc.  Occasionally, you may have more bleeding than usual after the banding procedure.  This is often from the untreated hemorrhoids rather than the treated one.  Don't be concerned if there is a tablespoon or so of blood.  If there is more blood than this, lie flat with your bottom higher than your head and apply an ice pack to the area. If the bleeding does not stop within a half an hour or if you feel faint, call our office at [phone] or go to the emergency room.  Problems are not common; however, if there is a substantial amount of bleeding, severe pain, chills, fever or difficulty passing urine (very rare) or other problems, you should call us at [phone] or report to the nearest emergency room.  Do not stay seated continuously for more than 2-3 hours for a day or two after the procedure.   Tighten your buttock muscles 10-15 times every two hours and take 10-15 deep breaths every 1-2 hours.  Do not spend more than a few minutes on the toilet if you cannot empty your bowel; instead re-visit the toilet at a later time.   Thank you for entrusting me with your care and choosing [facility].  Dr Tomasa Rand

## 2024-05-16 NOTE — Progress Notes (Unsigned)
 Patient reports that his hemorrhoid symptoms resolved for a few weeks, but then he started having recurrent rectal bleeding about 2 weeks ago.  He denies issues with constipation, straining, prolonged toilet sitting or hard stools, but does report frequent urges to defecate and incomplete evacuation.  He requests to proceed with further hemorrhoid banding if felt to be beneficial   PROCEDURE NOTE: The patient presents with symptomatic grade 3  hemorrhoids, requesting rubber band ligation of his/her hemorrhoidal disease.  All risks, benefits and alternative forms of therapy were described and informed consent was obtained.   The anorectum was pre-medicated with topical lidocaine (5%) and nitroglycerin (0.125%) The decision was made to band the right anterior internal hemorrhoid, and the [facility] O'Regan System was used to perform band ligation without complication.  Digital anorectal examination was then performed to assure proper positioning of the band, and to adjust the banded tissue as required.  The patient was discharged home without pain or other issues.  Dietary and behavioral recommendations were given and along with follow-up instructions.     The following adjunctive treatments were recommended:  Fiber supplementation Adequate water intake Avoidance of straining and hard stools  The patient will return in 2-4 weeks for  follow-up and possible additional banding as required. No complications were encountered and the patient tolerated the procedure well.  We discussed how hemorrhoidectomy would be the more definitive treatment for hemorrhoids, but that recovery is painful and more difficult.  I am happy to continue to band his hemorrhoids for now, as he does get some benefit, but if it appears that banding is futile, I will refer him to surgery

## 2024-07-04 ENCOUNTER — Encounter: Payer: Self-pay | Admitting: Gastroenterology

## 2024-07-04 ENCOUNTER — Ambulatory Visit: Admitting: Gastroenterology

## 2024-07-04 VITALS — BP 132/78 | HR 64 | Ht 65.0 in | Wt 164.0 lb

## 2024-07-04 DIAGNOSIS — K642 Third degree hemorrhoids: Secondary | ICD-10-CM | POA: Diagnosis not present

## 2024-07-04 NOTE — Patient Instructions (Signed)
 HEMORRHOID BANDING PROCEDURE    FOLLOW-UP CARE   The procedure you have had should have been relatively painless since the banding of the area involved does not have nerve endings and there is no pain sensation.  The rubber band cuts off the blood supply to the hemorrhoid and the band may fall off as soon as 48 hours after the banding (the band may occasionally be seen in the toilet bowl following a bowel movement). You may notice a temporary feeling of fullness in the rectum which should respond adequately to plain Tylenol or Motrin.  Following the banding, avoid strenuous exercise that evening and resume full activity the next day.  A sitz bath (soaking in a warm tub) or bidet is soothing, and can be useful for cleansing the area after bowel movements.     To avoid constipation, take two tablespoons of natural wheat bran, natural oat bran, flax, Benefiber or any over the counter fiber supplement and increase your water intake to 7-8 glasses daily.    Unless you have been prescribed anorectal medication, do not put anything inside your rectum for two weeks: No suppositories, enemas, fingers, etc.  Occasionally, you may have more bleeding than usual after the banding procedure.  This is often from the untreated hemorrhoids rather than the treated one.  Don't be concerned if there is a tablespoon or so of blood.  If there is more blood than this, lie flat with your bottom higher than your head and apply an ice pack to the area. If the bleeding does not stop within a half an hour or if you feel faint, call our office at [phone] or go to the emergency room.  Problems are not common; however, if there is a substantial amount of bleeding, severe pain, chills, fever or difficulty passing urine (very rare) or other problems, you should call us  at [phone] or report to the nearest emergency room.  Do not stay seated continuously for more than 2-3 hours for a day or two after the procedure.   Tighten your buttock muscles 10-15 times every two hours and take 10-15 deep breaths every 1-2 hours.  Do not spend more than a few minutes on the toilet if you cannot empty your bowel; instead re-visit the toilet at a later time.   _______________________________________________________  If your blood pressure at your visit was 140/90 or greater, please contact your primary care physician to follow up on this.  _______________________________________________________  If you are age 61 or older, your body mass index should be between 23-30. Your Body mass index is 27.29 kg/m. If this is out of the aforementioned range listed, please consider follow up with your Primary Care Provider.  If you are age 33 or younger, your body mass index should be between 19-25. Your Body mass index is 27.29 kg/m. If this is out of the aformentioned range listed, please consider follow up with your Primary Care Provider.   ________________________________________________________  The [facility] providers would like to encourage you to use MYCHART to communicate with providers for non-urgent requests or questions.  Due to long hold times on the telephone, sending your provider a message by [facility] may be a faster and more efficient way to get a response.  Please allow 48 business hours for a response.  Please remember that this is for non-urgent requests.  _______________________________________________________  [facility] is using a team-based approach to care.  Your team is made up of your doctor and two  to three APPS. Our APPS (Nurse Practitioners and Physician Assistants) work with your physician to ensure care continuity for you. They are fully qualified to address your health concerns and develop a treatment plan. They communicate directly with your gastroenterologist to care for you. Seeing the Advanced Practice Practitioners on your physician's team can help you by facilitating care more  promptly, often allowing for earlier appointments, access to diagnostic testing, procedures, and other specialty referrals.    It was a pleasure to see you today!  Thank you for trusting me with your gastrointestinal care!

## 2024-07-04 NOTE — Progress Notes (Signed)
 Mr. Blackshire presents today for further hemorrhoid banding.  He tolerated his last banding session well.  He had some discomfort that day and the next day, similar to previous banding's.  He did not have any bleeding for several weeks until about 3 weeks ago when he bled daily for about 6 or 7 days   05/16/2024:  Right anterior 03/14/2024:  Right posterior 01/04/2024:  Left lateral   PROCEDURE NOTE: The patient presents with symptomatic grade 3 hemorrhoids, requesting rubber band ligation of his/her hemorrhoidal disease.  All risks, benefits and alternative forms of therapy were described and informed consent was obtained.   The anorectum was pre-medicated with topical lidocaine (5%) and nitroglycerin (0.125%). Anoscopy was performed to better determine which hemorrhoid column to been next, given that all 3 columns have been banded once.  Prominent hemorrhoid columns were noted in the left lateral and right posterior column. The decision was made to band the left lateral internal hemorrhoid, and the [facility] O'Regan System was used to perform band ligation without complication.  Digital anorectal examination was then performed to assure proper positioning of the band, and to adjust the banded tissue as required.  The patient was discharged home without pain or other issues.  Dietary and behavioral recommendations were given and along with follow-up instructions.     The following adjunctive treatments were recommended:  Fiber supplementation Adequate water intake Avoidance of straining and hard stools  The patient will return in 2-4 weeks for  follow-up and possible additional banding as required. No complications were encountered and the patient tolerated the procedure well.

## 2024-07-15 ENCOUNTER — Encounter: Admitting: Gastroenterology

## 2024-07-31 ENCOUNTER — Encounter: Admitting: Gastroenterology

## 2024-09-25 ENCOUNTER — Ambulatory Visit: Admitting: Gastroenterology

## 2024-09-25 ENCOUNTER — Encounter: Payer: Self-pay | Admitting: Gastroenterology

## 2024-09-25 VITALS — BP 150/90 | HR 65 | Ht 65.0 in | Wt 163.4 lb

## 2024-09-25 DIAGNOSIS — K219 Gastro-esophageal reflux disease without esophagitis: Secondary | ICD-10-CM

## 2024-09-25 DIAGNOSIS — K642 Third degree hemorrhoids: Secondary | ICD-10-CM | POA: Diagnosis not present

## 2024-09-25 MED ORDER — OMEPRAZOLE 20 MG PO CPDR: 20.0000 mg | DELAYED_RELEASE_CAPSULE | Freq: Every day | ORAL | 0 refills | Status: AC

## 2024-09-25 NOTE — Progress Notes (Signed)
 Patient reports ongoing improvement in his hemorrhoid symptoms since his last banding.  He has only seen blood a couple of times since then and it was scant.  Infrequently has any prolapse symptoms.  Would like to proceed with 1 final banding  In addition he reports problems with bloating and belching.  He has been taking omeprazole  every few days.  I advised him to take it daily for a few weeks to see if this helps. If his symptoms do not improve, will see him in the office for further evaluation.   PROCEDURE NOTE: The patient presents with symptomatic grade 3  hemorrhoids, requesting rubber band ligation of his/her hemorrhoidal disease.  All risks, benefits and alternative forms of therapy were described and informed consent was obtained.   The anorectum was pre-medicated with topical lidocaine (5%) and nitroglycerin (0.125%) The decision was made to band the right posterior internal hemorrhoid, and the [facility] O'Regan System was used to perform band ligation without complication.  Digital anorectal examination was then performed to assure proper positioning of the band, and to adjust the banded tissue as required.  The patient was discharged home without pain or other issues.  Dietary and behavioral recommendations were given and along with follow-up instructions.     The following adjunctive treatments were recommended:  Fiber supplementation Adequate water intake Avoidance of straining and hard stools  No complications were encountered and the patient tolerated the procedure well. Will not plan on further banding at this point unless his symptoms worsen significantly.

## 2024-09-25 NOTE — Patient Instructions (Signed)
 We have sent the following medications to your pharmacy for you to pick up at your convenience: omeprazole  20 mg daily.   HEMORRHOID BANDING PROCEDURE    FOLLOW-UP CARE   The procedure you have had should have been relatively painless since the banding of the area involved does not have nerve endings and there is no pain sensation.  The rubber band cuts off the blood supply to the hemorrhoid and the band may fall off as soon as 48 hours after the banding (the band may occasionally be seen in the toilet bowl following a bowel movement). You may notice a temporary feeling of fullness in the rectum which should respond adequately to plain Tylenol or Motrin.  Following the banding, avoid strenuous exercise that evening and resume full activity the next day.  A sitz bath (soaking in a warm tub) or bidet is soothing, and can be useful for cleansing the area after bowel movements.     To avoid constipation, take two tablespoons of natural wheat bran, natural oat bran, flax, Benefiber or any over the counter fiber supplement and increase your water intake to 7-8 glasses daily.    Unless you have been prescribed anorectal medication, do not put anything inside your rectum for two weeks: No suppositories, enemas, fingers, etc.  Occasionally, you may have more bleeding than usual after the banding procedure.  This is often from the untreated hemorrhoids rather than the treated one.  Don't be concerned if there is a tablespoon or so of blood.  If there is more blood than this, lie flat with your bottom higher than your head and apply an ice pack to the area. If the bleeding does not stop within a half an hour or if you feel faint, call our office at [phone] or go to the emergency room.  Problems are not common; however, if there is a substantial amount of bleeding, severe pain, chills, fever or difficulty passing urine (very rare) or other problems, you should call us  at [phone] or report to  the nearest emergency room.  Do not stay seated continuously for more than 2-3 hours for a day or two after the procedure.  Tighten your buttock muscles 10-15 times every two hours and take 10-15 deep breaths every 1-2 hours.  Do not spend more than a few minutes on the toilet if you cannot empty your bowel; instead re-visit the toilet at a later time.
